# Patient Record
Sex: Female | Born: 1970 | Race: White | Hispanic: No | Marital: Single | Smoking: Never smoker
Health system: Southern US, Community
[De-identification: ages and names within clinical notes are randomized; demographics above are authoritative.]

## PROBLEM LIST (undated history)

## (undated) DIAGNOSIS — F32A Depression, unspecified: Secondary | ICD-10-CM

## (undated) DIAGNOSIS — F419 Anxiety disorder, unspecified: Secondary | ICD-10-CM

## (undated) DIAGNOSIS — K219 Gastro-esophageal reflux disease without esophagitis: Secondary | ICD-10-CM

## (undated) DIAGNOSIS — K589 Irritable bowel syndrome without diarrhea: Secondary | ICD-10-CM

## (undated) HISTORY — DX: Depression, unspecified: F32.A

## (undated) HISTORY — PX: SPINE SURGERY: SHX786

## (undated) HISTORY — DX: Anxiety disorder, unspecified: F41.9

## (undated) HISTORY — DX: Gastro-esophageal reflux disease without esophagitis: K21.9

---

## 2016-03-06 ENCOUNTER — Other Ambulatory Visit: Payer: Self-pay | Admitting: Family Medicine

## 2016-03-06 ENCOUNTER — Other Ambulatory Visit (HOSPITAL_COMMUNITY)
Admission: RE | Admit: 2016-03-06 | Discharge: 2016-03-06 | Disposition: A | Discharge status: Home / Self Care | Payer: Managed Care, Other (non HMO) | Source: Ambulatory Visit | Attending: Family Medicine | Admitting: Family Medicine

## 2016-03-06 DIAGNOSIS — Z01419 Encounter for gynecological examination (general) (routine) without abnormal findings: Secondary | ICD-10-CM | POA: Diagnosis present

## 2016-03-06 DIAGNOSIS — Z0000 Encounter for general adult medical examination without abnormal findings: Secondary | ICD-10-CM | POA: Diagnosis present

## 2016-03-09 LAB — CYTOLOGY - PAP

## 2016-06-15 DIAGNOSIS — K589 Irritable bowel syndrome without diarrhea: Secondary | ICD-10-CM

## 2016-06-15 HISTORY — DX: Irritable bowel syndrome, unspecified: K58.9

## 2018-10-21 ENCOUNTER — Encounter (HOSPITAL_COMMUNITY): Payer: Self-pay | Admitting: Emergency Medicine

## 2018-10-21 ENCOUNTER — Other Ambulatory Visit: Payer: Self-pay

## 2018-10-21 ENCOUNTER — Emergency Department (HOSPITAL_COMMUNITY)
Admission: EM | Admit: 2018-10-21 | Discharge: 2018-10-22 | Disposition: A | Discharge status: Home / Self Care | Payer: Managed Care, Other (non HMO) | Attending: Emergency Medicine | Admitting: Emergency Medicine

## 2018-10-21 DIAGNOSIS — K5900 Constipation, unspecified: Secondary | ICD-10-CM | POA: Insufficient documentation

## 2018-10-21 DIAGNOSIS — R109 Unspecified abdominal pain: Secondary | ICD-10-CM | POA: Diagnosis present

## 2018-10-21 HISTORY — DX: Irritable bowel syndrome without diarrhea: K58.9

## 2018-10-21 MED ORDER — SODIUM CHLORIDE 0.9% FLUSH: 3.0000 mL | Freq: Once | INTRAVENOUS | Status: DC

## 2018-10-21 NOTE — ED Triage Notes (Signed)
Pt presents to ED c/o abd pain w/ constipation since Monday, and bloating. Not passing gas, no n/v, no fever, no change in diet. Pt took suppository around 1800 w/ no relief and added anal discharge.

## 2018-10-22 LAB — URINALYSIS, ROUTINE W REFLEX MICROSCOPIC
Bilirubin Urine: NEGATIVE
Glucose, UA: NEGATIVE mg/dL
Ketones, ur: NEGATIVE mg/dL
Leukocytes,Ua: NEGATIVE
Nitrite: NEGATIVE
Protein, ur: NEGATIVE mg/dL
Specific Gravity, Urine: 1.005 (ref 1.005–1.030)
pH: 5 (ref 5.0–8.0)

## 2018-10-22 LAB — CBC
HCT: 41.8 % (ref 36.0–46.0)
Hemoglobin: 14 g/dL (ref 12.0–15.0)
MCH: 30.8 pg (ref 26.0–34.0)
MCHC: 33.5 g/dL (ref 30.0–36.0)
MCV: 92.1 fL (ref 80.0–100.0)
Platelets: 220 10*3/uL (ref 150–400)
RBC: 4.54 MIL/uL (ref 3.87–5.11)
RDW: 12.8 % (ref 11.5–15.5)
WBC: 13.8 10*3/uL — ABNORMAL HIGH (ref 4.0–10.5)
nRBC: 0 % (ref 0.0–0.2)

## 2018-10-22 LAB — COMPREHENSIVE METABOLIC PANEL
ALT: 16 U/L (ref 0–44)
AST: 18 U/L (ref 15–41)
Albumin: 4.1 g/dL (ref 3.5–5.0)
Alkaline Phosphatase: 78 U/L (ref 38–126)
Anion gap: 8 (ref 5–15)
BUN: 10 mg/dL (ref 6–20)
CO2: 22 mmol/L (ref 22–32)
Calcium: 9.5 mg/dL (ref 8.9–10.3)
Chloride: 106 mmol/L (ref 98–111)
Creatinine, Ser: 0.94 mg/dL (ref 0.44–1.00)
GFR calc Af Amer: >60 mL/min (ref >60)
GFR calc non Af Amer: >60 mL/min (ref >60)
Glucose, Bld: 122 mg/dL — ABNORMAL HIGH (ref 70–99)
Potassium: 4.1 mmol/L (ref 3.5–5.1)
Sodium: 136 mmol/L (ref 135–145)
Total Bilirubin: 0.6 mg/dL (ref 0.3–1.2)
Total Protein: 7.2 g/dL (ref 6.5–8.1)

## 2018-10-22 LAB — LIPASE, BLOOD: Lipase: 31 U/L (ref 11–51)

## 2018-10-22 MED ORDER — ONDANSETRON 4 MG PO TBDP: 4.0000 mg | ORAL_TABLET | Freq: Four times a day (QID) | ORAL | 0 refills | Status: DC | PRN

## 2018-10-22 MED ORDER — DICYCLOMINE HCL 20 MG PO TABS: 20.0000 mg | ORAL_TABLET | Freq: Three times a day (TID) | ORAL | 0 refills | Status: DC | PRN

## 2018-10-22 NOTE — ED Provider Notes (Signed)
TIME SEEN: 2:49 AM  CHIEF COMPLAINT: Constipation  HPI: Patient is a 48 year old female with history of IBS who presents the emergency department with constipation.  States that she was not able to have a bowel movement and felt very uncomfortable.  Tried a suppository at home without any relief.  Was not passing gas and started having increasing abdominal cramping.  Came to the ED for further evaluation.  No previous history of bowel obstruction.  No nausea, vomiting, fever.  States in the waiting room she had a large bowel movement and was able to pass gas and reports she is feeling much better and almost completely back to normal.  ROS: See HPI Constitutional: no fever  Eyes: no drainage  ENT: no runny nose   Cardiovascular:  no chest pain  Resp: no SOB  GI: no vomiting GU: no dysuria Integumentary: no rash  Allergy: no hives  Musculoskeletal: no leg swelling  Neurological: no slurred speech ROS otherwise negative  PAST MEDICAL HISTORY/PAST SURGICAL HISTORY:  Past Medical History:  Diagnosis Date  . IBS (irritable bowel syndrome) 2018    MEDICATIONS:  Prior to Admission medications   Not on File    ALLERGIES:  Not on File  SOCIAL HISTORY:  Social History   Tobacco Use  . Smoking status: Never Smoker  . Smokeless tobacco: Never Used  Substance Use Topics  . Alcohol use: Never    Frequency: Never    FAMILY HISTORY: History reviewed. No pertinent family history.  EXAM: BP 132/82 (BP Location: Right Arm)   Pulse 98   Temp 98.4 F (36.9 C) (Oral)   Resp 16   Ht 5\' 7"  (1.702 m)   Wt 75.3 kg   SpO2 99%   BMI 26.00 kg/m  CONSTITUTIONAL: Alert and oriented and responds appropriately to questions. Well-appearing; well-nourished, in no distress, afebrile, well-hydrated HEAD: Normocephalic EYES: Conjunctivae clear, pupils appear equal ENT: normal nose; moist mucous membranes NECK: Supple, normal range of motion CARD: RRR RESP: Normal chest excursion without  splinting or tachypnea;  no hypoxia or respiratory distress, speaking full sentences ABD/GI: Normal bowel sounds; non-distended; soft, non-tender, no rebound, no guarding, no peritoneal signs, no hepatosplenomegaly BACK: More range of motion EXT: Normal ROM in all joints; no major deformity noted SKIN: Normal color for age and race; warm; no rash on exposed skin NEURO: Moves all extremities equally PSYCH: The patient's mood and manner are appropriate. Grooming and personal hygiene are appropriate.  MEDICAL DECISION MAKING: Patient here with constipation.  Had a large bowel movement in the waiting room and states she is feeling back to normal.  Vital signs unremarkable.  Abdominal exam benign.  Labs show mild leukocytosis but otherwise normal.  Have low suspicion for bowel obstruction at this time.  She states she feels comfortable with plan for discharge home has a gastroenterologist.  Have recommended bowel regimen for home and will discharge with prescriptions of Bentyl and Zofran if she begins to have symptoms again.  We have discussed at length return precautions.  She verbalized understanding.  Doubt appendicitis, colitis, diverticulitis, pancreatitis, bowel perforation, pyelonephritis.  I do not feel she needs emergent imaging of her abdomen today.  At this time, I do not feel there is any life-threatening condition present. I have reviewed and discussed all results (EKG, imaging, lab, urine as appropriate) and exam findings with patient/family. I have reviewed nursing notes and appropriate previous records.  I feel the patient is safe to be discharged home without further emergent workup  and can continue workup as an outpatient as needed. Discussed usual and customary return precautions. Patient/family verbalize understanding and are comfortable with this plan.  Outpatient follow-up has been provided as needed. All questions have been answered.      Greig Altergott, Layla MawKristen N, DO 10/22/18 [id number]

## 2018-10-22 NOTE — Discharge Instructions (Signed)
I recommend that you increase your water and fiber intake. If you are not able to eat foods high in fiber, you may use Benefiber or Metamucil over-the-counter. I also recommend you use MiraLAX 1-2 times a day and Colace 100 mg twice a day to help with bowel movements. These medications are over the counter.  You may use other over-the-counter medications such as Dulcolax, Fleet enemas, magnesium citrate as needed for constipation. Please note that some of these medications may cause you to have abdominal cramping which is normal. If you develop severe abdominal pain, fever (temperature of 100.4 or higher), persistent vomiting, distention of your abdomen, unable to have a bowel movement for 5 days or are not passing gas, please return to the hospital.   Please follow-up with your gastroenterologist.

## 2018-10-22 NOTE — ED Notes (Signed)
Pt ould not  Void at this time.  Pt has a cup.

## 2018-11-02 ENCOUNTER — Encounter (HOSPITAL_COMMUNITY): Payer: Self-pay | Admitting: Emergency Medicine

## 2018-11-02 ENCOUNTER — Ambulatory Visit (HOSPITAL_COMMUNITY)
Admission: EM | Admit: 2018-11-02 | Discharge: 2018-11-02 | Disposition: A | Discharge status: Home / Self Care | Payer: Managed Care, Other (non HMO) | Attending: Family Medicine | Admitting: Family Medicine

## 2018-11-02 ENCOUNTER — Other Ambulatory Visit: Payer: Self-pay

## 2018-11-02 DIAGNOSIS — N611 Abscess of the breast and nipple: Secondary | ICD-10-CM | POA: Diagnosis not present

## 2018-11-02 MED ORDER — TRAMADOL HCL 50 MG PO TABS: 50.0000 mg | ORAL_TABLET | Freq: Four times a day (QID) | ORAL | 0 refills | Status: DC | PRN

## 2018-11-02 MED ORDER — AMOXICILLIN-POT CLAVULANATE 875-125 MG PO TABS: 1.0000 | ORAL_TABLET | Freq: Two times a day (BID) | ORAL | 0 refills | Status: AC

## 2018-11-02 NOTE — ED Triage Notes (Signed)
Pt sts left breast pain and swelling

## 2018-11-02 NOTE — Discharge Instructions (Signed)
Please begin Augmentin twice daily for the next week Warm compresses to further help with swelling and discomfort Tylenol 500-1,000 mg every 4-6 hours for pain May use tramadol for severe pain, limit use to at home or bedtime, do not drive or work after taking Please follow-up if not having any improvement with antibiotics in approximately 3 days as we may need to refer you to the breast center for aspiration/drainage of this abscess.

## 2018-11-03 NOTE — ED Provider Notes (Signed)
[facility]    CSN: [id number] Arrival date & time: 11/02/18  1803     History   Chief Complaint Chief Complaint  Patient presents with  . Breast Pain    HPI Rhonda Wood is a 48 y.o. female history of IBS presenting today for evaluation of left breast pain and swelling.  Patient states that over the past 2 days she has developed increased redness pain and swelling around her left areola.  She has not noticed any discharge.  Denies any fevers.  Denies history of similar.  Denies breast-feeding.  She has not take anything over-the-counter for her symptoms.  HPI  Past Medical History:  Diagnosis Date  . IBS (irritable bowel syndrome) 2018    There are no active problems to display for this patient.   History reviewed. No pertinent surgical history.  OB History   No obstetric history on file.      Home Medications    Prior to Admission medications   Medication Sig Start Date End Date Taking? Authorizing Provider  amoxicillin-clavulanate (AUGMENTIN) 875-125 MG tablet Take 1 tablet by mouth every 12 (twelve) hours for 7 days. 11/02/18 11/09/18  Wieters, Hallie C, PA-C  dicyclomine (BENTYL) 20 MG tablet Take 1 tablet (20 mg total) by mouth every 8 (eight) hours as needed for spasms (Abdominal cramping). 10/22/18   Ward, Layla Maw, DO  ondansetron (ZOFRAN ODT) 4 MG disintegrating tablet Take 1 tablet (4 mg total) by mouth every 6 (six) hours as needed. 10/22/18   Ward, Layla Maw, DO  traMADol (ULTRAM) 50 MG tablet Take 1 tablet (50 mg total) by mouth every 6 (six) hours as needed for severe pain. 11/02/18   Wieters, Junius Creamer, PA-C    Family History History reviewed. No pertinent family history.  Social History Social History   Tobacco Use  . Smoking status: Never Smoker  . Smokeless tobacco: Never Used  Substance Use Topics  . Alcohol use: Never    Frequency: Never  . Drug use: Never     Allergies   Patient has no known allergies.   Review of Systems  Review of Systems  Constitutional: Negative for fatigue and fever.  HENT: Negative for mouth sores.   Eyes: Negative for visual disturbance.  Respiratory: Negative for shortness of breath.   Cardiovascular: Negative for chest pain.  Gastrointestinal: Negative for abdominal pain, nausea and vomiting.  Genitourinary: Negative for genital sores.  Musculoskeletal: Negative for arthralgias and joint swelling.  Skin: Positive for color change. Negative for rash and wound.  Neurological: Negative for dizziness, weakness, light-headedness and headaches.     Physical Exam Triage Vital Signs ED Triage Vitals [11/02/18 1824]  Enc Vitals Group     BP 133/75     Pulse Rate 99     Resp 18     Temp 99.1 F (37.3 C)     Temp Source Oral     SpO2 100 %     Weight      Height      Head Circumference      Peak Flow      Pain Score 6     Pain Loc      Pain Edu?      Excl. in GC?    No data found.  Updated Vital Signs BP 133/75 (BP Location: Right Arm)   Pulse 99   Temp 99.1 F (37.3 C) (Oral)   Resp 18   SpO2 100%   Visual Acuity Right Eye  Distance:   Left Eye Distance:   Bilateral Distance:    Right Eye Near:   Left Eye Near:    Bilateral Near:     Physical Exam Vitals signs and nursing note reviewed.  Constitutional:      Appearance: She is well-developed.     Comments: No acute distress  HENT:     Head: Normocephalic and atraumatic.     Nose: Nose normal.  Eyes:     Conjunctiva/sclera: Conjunctivae normal.  Neck:     Musculoskeletal: Neck supple.  Cardiovascular:     Rate and Rhythm: Normal rate.  Pulmonary:     Effort: Pulmonary effort is normal. No respiratory distress.  Chest:       Comments: Left upper outer quadrant of left breast with erythema induration and tenderness to touch; no discharge expressed from nipple Abdominal:     General: There is no distension.  Musculoskeletal: Normal range of motion.  Skin:    General: Skin is warm and dry.   Neurological:     Mental Status: She is alert and oriented to person, place, and time.      UC Treatments / Results  Labs (all labs ordered are listed, but only abnormal results are displayed) Labs Reviewed - No data to display  EKG None  Radiology No results found.  Procedures Procedures (including critical care time)  Medications Ordered in UC Medications - No data to display  Initial Impression / Assessment and Plan / UC Course  I have reviewed the triage vital signs and the nursing notes.  Pertinent labs & imaging results that were available during my care of the patient were reviewed by me and considered in my medical decision making (see chart for details).     Patient appears to have a cellulitis versus abscess of left breast.  Will initiate on Augmentin twice daily for the next week.  Will have follow-up in 3 days if not having any improvement with oral antibiotics as may need referral to breast center for aspiration.  Warm compresses, Tylenol.  Tramadol for severe pain.Discussed strict return precautions. Patient verbalized understanding and is agreeable with plan.  Final Clinical Impressions(s) / UC Diagnoses   Final diagnoses:  Left breast abscess     Discharge Instructions     Please begin Augmentin twice daily for the next week Warm compresses to further help with swelling and discomfort Tylenol 500-1,000 mg every 4-6 hours for pain May use tramadol for severe pain, limit use to at home or bedtime, do not drive or work after taking Please follow-up if not having any improvement with antibiotics in approximately 3 days as we may need to refer you to the breast center for aspiration/drainage of this abscess.   ED Prescriptions    Medication Sig Dispense Auth. Provider   amoxicillin-clavulanate (AUGMENTIN) 875-125 MG tablet Take 1 tablet by mouth every 12 (twelve) hours for 7 days. 14 tablet Wieters, Hallie C, PA-C   traMADol (ULTRAM) 50 MG tablet Take 1  tablet (50 mg total) by mouth every 6 (six) hours as needed for severe pain. 8 tablet Wieters, DorranceHallie C, PA-C     Controlled Substance Prescriptions [place] Controlled Substance Registry consulted? Yes, I have consulted the  Controlled Substances Registry for this patient, and feel the risk/benefit ratio today is favorable for proceeding with this prescription for a controlled substance.   Wieters, RyanHallie C, PA-C 11/03/18 1016

## 2018-11-14 ENCOUNTER — Other Ambulatory Visit: Payer: Self-pay

## 2018-11-14 ENCOUNTER — Encounter (HOSPITAL_COMMUNITY): Payer: Self-pay

## 2018-11-14 ENCOUNTER — Ambulatory Visit (HOSPITAL_COMMUNITY)
Admission: EM | Admit: 2018-11-14 | Discharge: 2018-11-14 | Disposition: A | Discharge status: Home / Self Care | Payer: Managed Care, Other (non HMO) | Attending: Family Medicine | Admitting: Family Medicine

## 2018-11-14 DIAGNOSIS — N61 Mastitis without abscess: Secondary | ICD-10-CM

## 2018-11-14 MED ORDER — ONDANSETRON 4 MG PO TBDP: 4.0000 mg | ORAL_TABLET | Freq: Four times a day (QID) | ORAL | 0 refills | Status: DC | PRN

## 2018-11-14 MED ORDER — DOXYCYCLINE HYCLATE 100 MG PO TABS: 100.0000 mg | ORAL_TABLET | Freq: Two times a day (BID) | ORAL | 0 refills | Status: DC

## 2018-11-14 NOTE — ED Provider Notes (Signed)
[facility]    CSN: [id number] Arrival date & time: 11/14/18  1022     History   Chief Complaint Chief Complaint  Patient presents with  . follow up on breast    HPI Jenique Berendsen is a 48 y.o. female.   48 yo woman here for follow up of left breast abscess, seen recently and treated with antibiotics and pain medicine.  She had had a cat scratch the areola. The augmentin seemed to help for awhile, but the pain and swelling have continued.    Note from 11/02/18: Ayviana Canto is a 48 y.o. female history of IBS presenting today for evaluation of left breast pain and swelling.  Patient states that over the past 2 days she has developed increased redness pain and swelling around her left areola.  She has not noticed any discharge.  Denies any fevers.  Denies history of similar.  Denies breast-feeding.  She has not take anything over-the-counter for her symptoms.     Comments: Left upper outer quadrant of left breast with erythema induration and tenderness to touch; no discharge expressed from nipple  Rx:  Augmentin and Tramadol     Past Medical History:  Diagnosis Date  . IBS (irritable bowel syndrome) 2018    There are no active problems to display for this patient.   History reviewed. No pertinent surgical history.  OB History   No obstetric history on file.      Home Medications    Prior to Admission medications   Medication Sig Start Date End Date Taking? Authorizing Provider  dicyclomine (BENTYL) 20 MG tablet Take 1 tablet (20 mg total) by mouth every 8 (eight) hours as needed for spasms (Abdominal cramping). 10/22/18   Ward, Layla Maw, DO  doxycycline (VIBRA-TABS) 100 MG tablet Take 1 tablet (100 mg total) by mouth 2 (two) times daily. 11/14/18   Elvina Sidle, MD  ondansetron (ZOFRAN ODT) 4 MG disintegrating tablet Take 1 tablet (4 mg total) by mouth every 6 (six) hours as needed. 11/14/18   Elvina Sidle, MD  traMADol (ULTRAM) 50 MG tablet Take  1 tablet (50 mg total) by mouth every 6 (six) hours as needed for severe pain. 11/02/18   Wieters, Junius Creamer, PA-C    Family History History reviewed. No pertinent family history.  Social History Social History   Tobacco Use  . Smoking status: Never Smoker  . Smokeless tobacco: Never Used  Substance Use Topics  . Alcohol use: Never    Frequency: Never  . Drug use: Never     Allergies   Patient has no known allergies.   Review of Systems Review of Systems  Cardiovascular: Positive for chest pain.  Skin: Positive for rash.  All other systems reviewed and are negative.    Physical Exam Triage Vital Signs ED Triage Vitals [11/14/18 1047]  Enc Vitals Group     BP      Pulse      Resp      Temp      Temp src      SpO2      Weight 165 lb (74.8 kg)     Height      Head Circumference      Peak Flow      Pain Score 4     Pain Loc      Pain Edu?      Excl. in GC?    No data found.  Updated Vital Signs BP 116/81 (BP Location:  Right Arm)   Pulse 84   Temp 98.2 F (36.8 C) (Oral)   Resp 18   Wt 74.8 kg   LMP 10/14/2018   SpO2 99%   BMI 25.84 kg/m    Physical Exam Vitals signs and nursing note reviewed.  Constitutional:      Appearance: Normal appearance.  Eyes:     Conjunctiva/sclera: Conjunctivae normal.  Neck:     Musculoskeletal: Normal range of motion and neck supple.  Pulmonary:     Effort: Pulmonary effort is normal.  Chest:    Musculoskeletal: Normal range of motion.  Skin:    Comments: Tender, indurated, nonfluctuant sub areolar mass with overlying erythema left breast  Neurological:     General: No focal deficit present.     Mental Status: She is alert.  Psychiatric:        Mood and Affect: Mood normal.      UC Treatments / Results  Labs (all labs ordered are listed, but only abnormal results are displayed) Labs Reviewed - No data to display  EKG None  Radiology No results found.  Procedures Procedures (including critical  care time)  Medications Ordered in UC Medications - No data to display  Initial Impression / Assessment and Plan / UC Course  I have reviewed the triage vital signs and the nursing notes.  Pertinent labs & imaging results that were available during my care of the patient were reviewed by me and considered in my medical decision making (see chart for details).    Final Clinical Impressions(s) / UC Diagnoses   Final diagnoses:  Cellulitis of breast     Discharge Instructions     The breast center should be calling you today to set up an appointment  I have written the name of a good general surgeon to help you if you need surgery  Start the doxycycline today with food.  Continue the moist warm compresses three times a day    ED Prescriptions    Medication Sig Dispense Auth. Provider   ondansetron (ZOFRAN ODT) 4 MG disintegrating tablet Take 1 tablet (4 mg total) by mouth every 6 (six) hours as needed. 20 tablet Elvina SidleLauenstein, Wiletta Bermingham, MD   doxycycline (VIBRA-TABS) 100 MG tablet Take 1 tablet (100 mg total) by mouth 2 (two) times daily. 20 tablet Elvina SidleLauenstein, Yasmine Kilbourne, MD     Controlled Substance Prescriptions [place] Controlled Substance Registry consulted? Not Applicable   Elvina SidleLauenstein, Areta Terwilliger, MD 11/14/18 1110

## 2018-11-14 NOTE — ED Triage Notes (Signed)
Pt states she's here for a f/u on her left breast. Pt states she has finished her antibiotics for the lump in her left breast. Pt states its sore.

## 2018-11-14 NOTE — Discharge Instructions (Signed)
The breast center should be calling you today to set up an appointment  I have written the name of a good general surgeon to help you if you need surgery  Start the doxycycline today with food.  Continue the moist warm compresses three times a day

## 2018-11-15 ENCOUNTER — Other Ambulatory Visit: Payer: Self-pay | Admitting: Family Medicine

## 2018-11-15 DIAGNOSIS — N644 Mastodynia: Secondary | ICD-10-CM

## 2018-11-30 ENCOUNTER — Ambulatory Visit
Admission: RE | Admit: 2018-11-30 | Discharge: 2018-11-30 | Disposition: A | Discharge status: Home / Self Care | Payer: Managed Care, Other (non HMO) | Source: Ambulatory Visit | Attending: Family Medicine | Admitting: Family Medicine

## 2018-11-30 ENCOUNTER — Other Ambulatory Visit: Payer: Self-pay

## 2018-11-30 DIAGNOSIS — N644 Mastodynia: Secondary | ICD-10-CM

## 2019-07-23 IMAGING — MG DIGITAL DIAGNOSTIC BILATERAL MAMMOGRAM WITH TOMO AND CAD
8 series · 8 of 24 positions shown · non-contrast
Comparison: None.

CLINICAL DATA: Patient complains of diffuse left breast pain and a
palpable abnormality. Patient states that she was recently treated
with antibiotics for a left breast infection.

EXAM:
DIGITAL DIAGNOSTIC BILATERAL MAMMOGRAM WITH CAD AND TOMO
ULTRASOUND LEFT BREAST

[R MLO synth-2D]
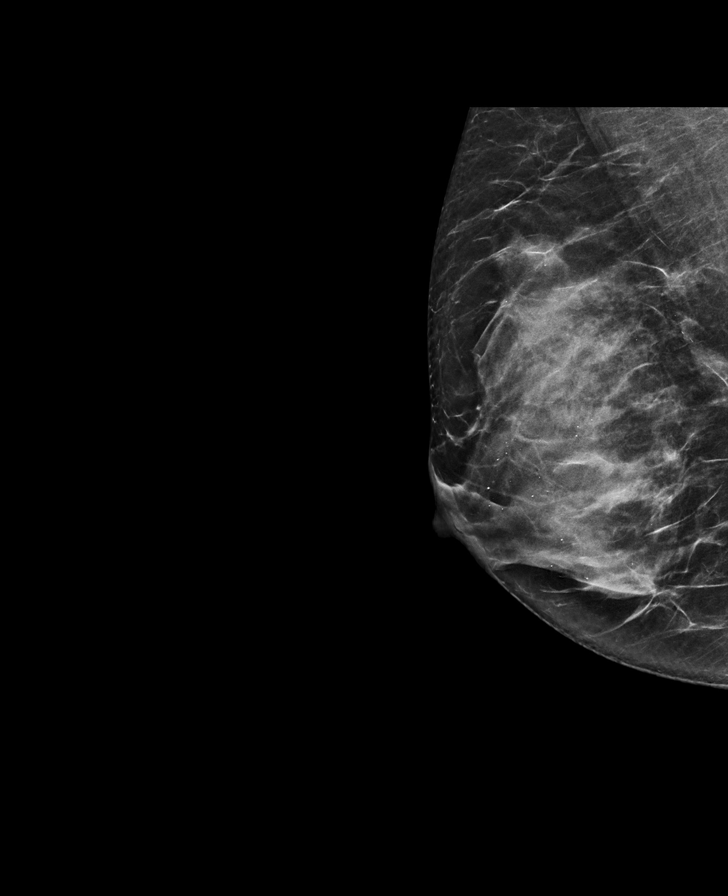

[L CC synth-2D]
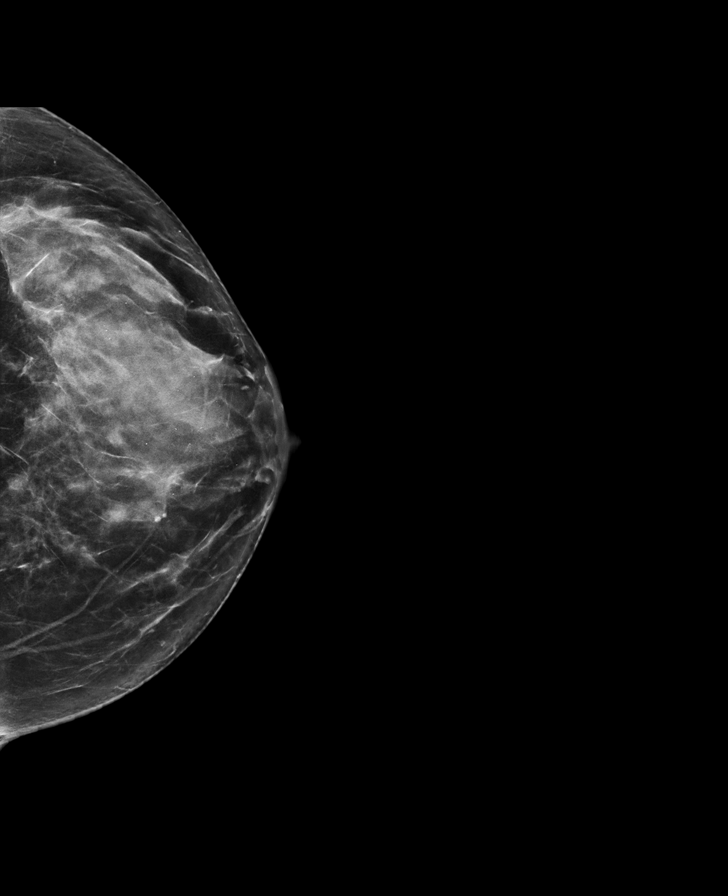

[R CC synth-2D]
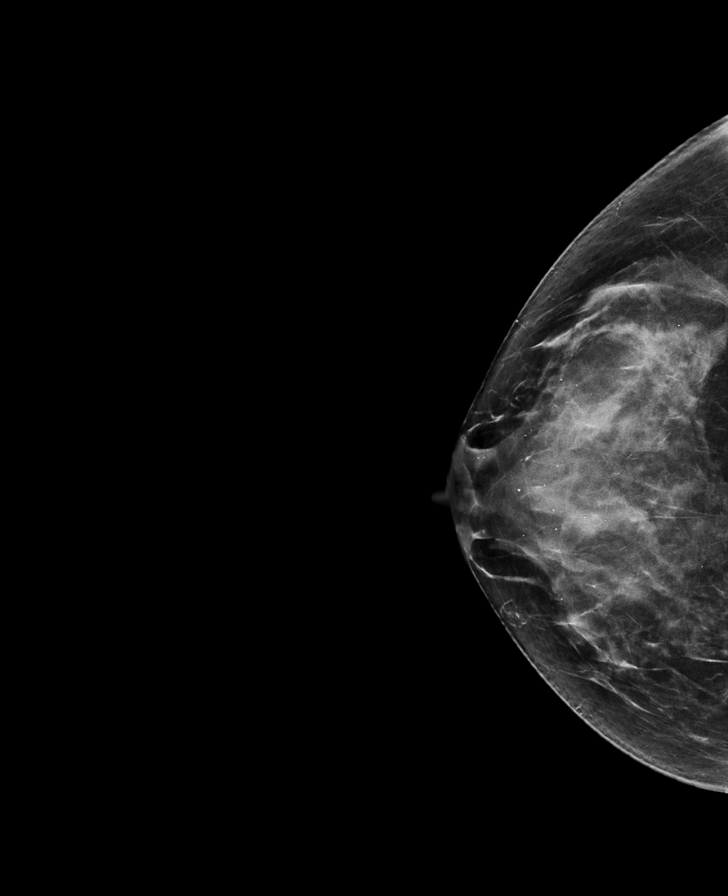

[L MLO synth-2D]
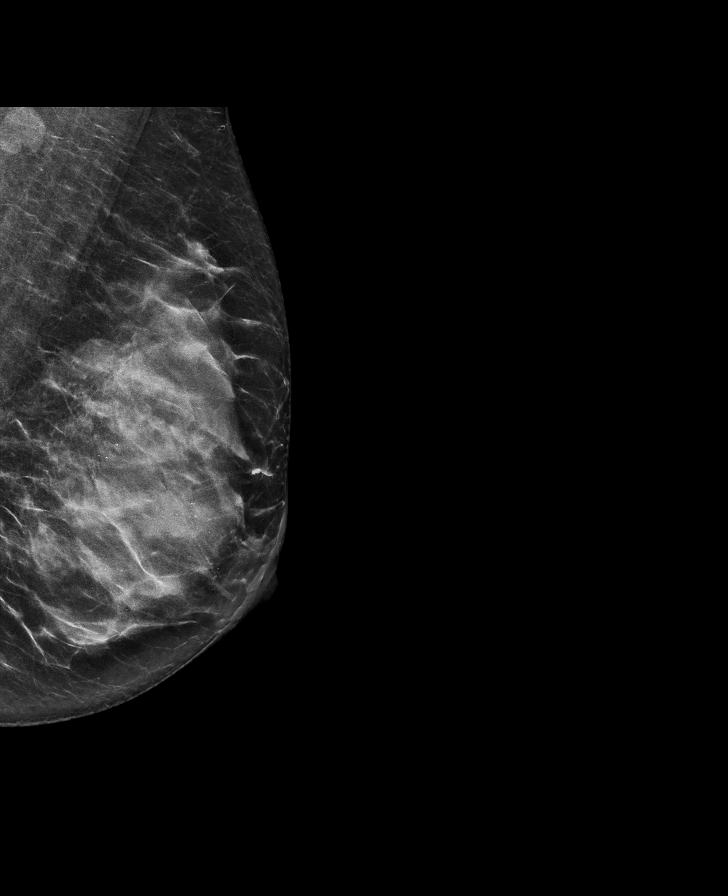

[L CC tomo · tomo slice 36/71.0]
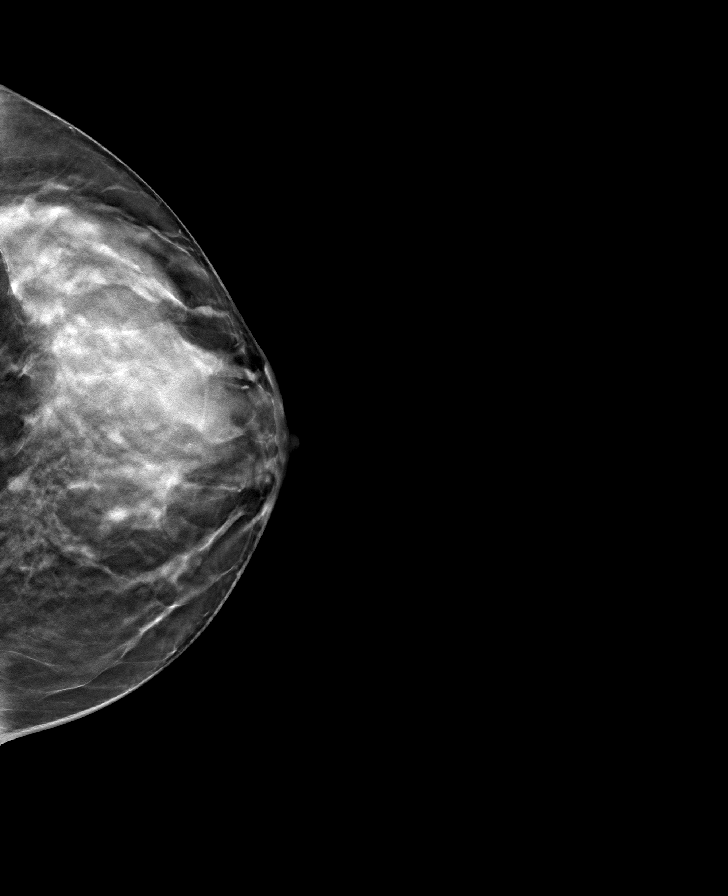

[R CC tomo · tomo slice 37/73.0]
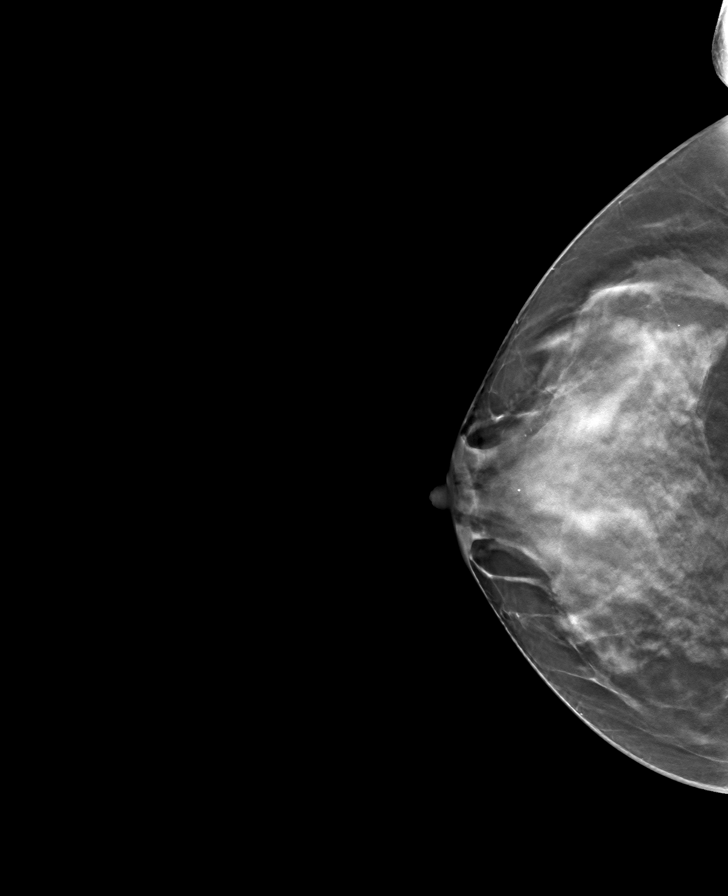

[R MLO tomo · tomo slice 35/68.0]
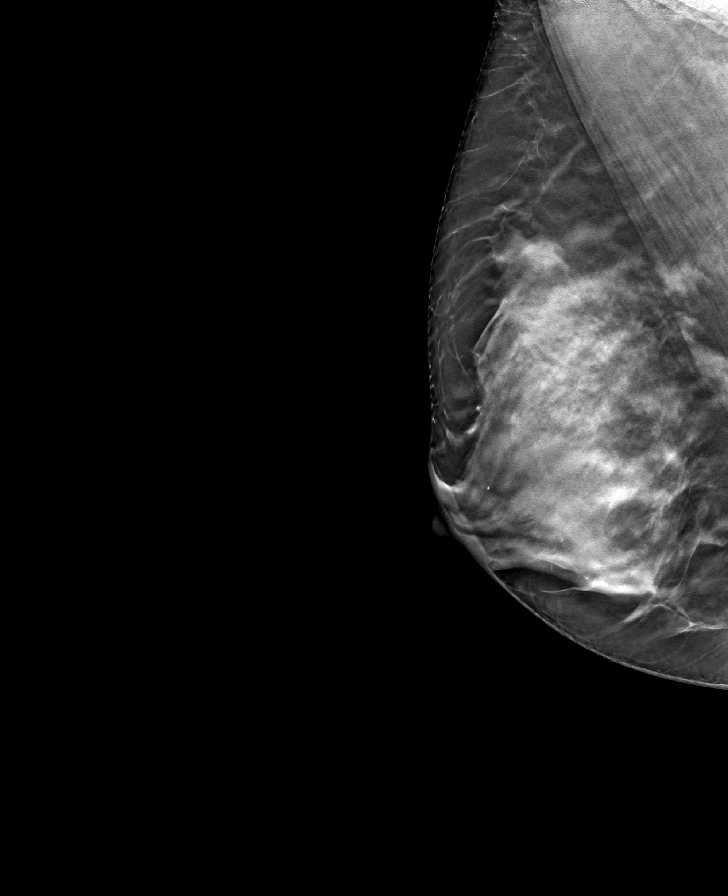

[L MLO tomo · tomo slice 35/70.0]
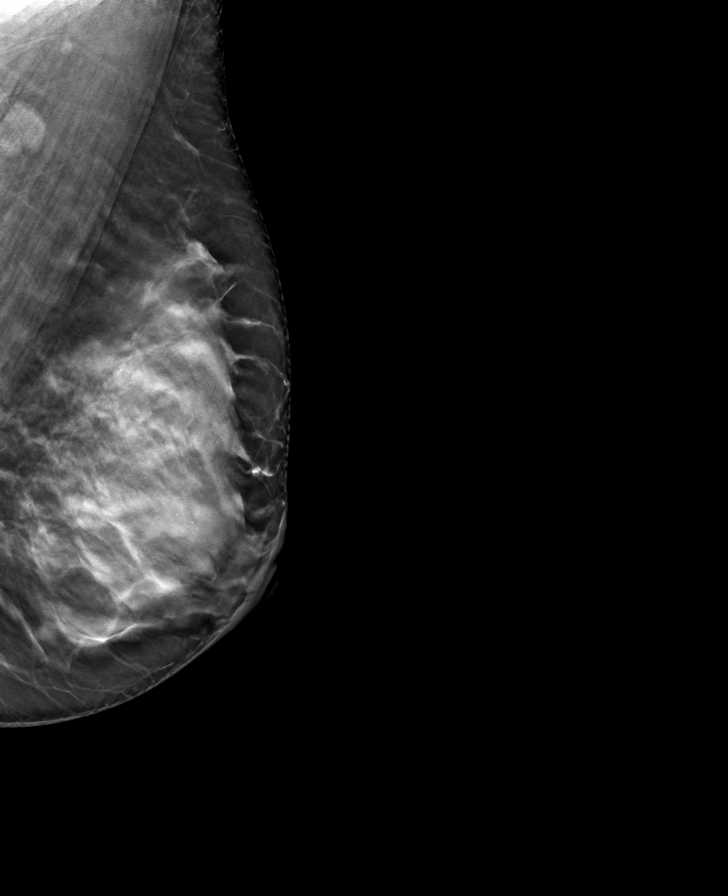

[8 of 24 positions shown; findings below may reference images not displayed]

ACR Breast Density Category c: The breast tissue is heterogeneously
dense, which may obscure small masses.
FINDINGS: No suspicious mass, malignant type microcalcifications or distortion
detected in either breast.

Mammographic images were processed with CAD.

On physical exam, I do not palpate a discrete mass in the
retroareolar region of the left breast.

Targeted ultrasound is performed, showing normal tissue in the
retroareolar region of the left breast. No solid or cystic mass,
abnormal shadowing or distortion visualized.
IMPRESSION: No evidence of malignancy in either breast.

RECOMMENDATION:
Bilateral screening mammogram in 1 year is recommended.

I have discussed the findings and recommendations with the patient.
Results were also provided in writing at the conclusion of the
visit. If applicable, a reminder letter will be sent to the patient
regarding the next appointment.

BI-RADS CATEGORY  1: Negative.

## 2019-09-11 ENCOUNTER — Ambulatory Visit: Payer: Managed Care, Other (non HMO)

## 2020-10-07 ENCOUNTER — Encounter (HOSPITAL_COMMUNITY): Payer: Self-pay

## 2020-10-07 ENCOUNTER — Ambulatory Visit (HOSPITAL_COMMUNITY)
Admission: EM | Admit: 2020-10-07 | Discharge: 2020-10-07 | Disposition: A | Discharge status: Home / Self Care | Payer: Managed Care, Other (non HMO) | Attending: Emergency Medicine | Admitting: Emergency Medicine

## 2020-10-07 DIAGNOSIS — M5442 Lumbago with sciatica, left side: Secondary | ICD-10-CM | POA: Diagnosis not present

## 2020-10-07 DIAGNOSIS — M5441 Lumbago with sciatica, right side: Secondary | ICD-10-CM

## 2020-10-07 MED ORDER — ORPHENADRINE CITRATE ER 100 MG PO TB12: 100.0000 mg | ORAL_TABLET | Freq: Two times a day (BID) | ORAL | 0 refills | Status: DC | PRN

## 2020-10-07 MED ORDER — METHYLPREDNISOLONE ACETATE 80 MG/ML IJ SUSP
80.0000 mg | Freq: Once | INTRAMUSCULAR | Status: AC
  Administered 2020-10-07: 80 mg via INTRAMUSCULAR

## 2020-10-07 MED ORDER — METHYLPREDNISOLONE 4 MG PO TBPK: ORAL_TABLET | ORAL | 0 refills | Status: DC

## 2020-10-07 MED ORDER — METHYLPREDNISOLONE ACETATE 80 MG/ML IJ SUSP
INTRAMUSCULAR | Status: AC
  Filled 2020-10-07: qty 1

## 2020-10-07 NOTE — ED Provider Notes (Signed)
[facility]    CSN: [id number] Arrival date & time: 10/07/20  0825      History   Chief Complaint Chief Complaint  Patient presents with  . Back Pain    HPI Rhonda Wood is a 50 y.o. female.   Rhonda Wood presents with complaints of low back pain which radiates down bilateral posterior thighs. Started around a month ago without any specific known trigger. She works as an Environmental health practitioner, does life some packages occasionally. No numbness tingling or weakness to lower legs, no saddle symptoms. No loss of bladder or bowel. Pain worse to left side. Has had low back issues in the past but this is more severe and lasting longer. No fevers. She went to another UC last week and was given muscle relaxers which did provide some relief. Ibuprofen only provides some relief, but causes significant gi distress causing her to vomit when she takes, so has taken minimally. Doesn't have a PCP. Pain is worse with bending such as with putting on her socks, or with raising her legs such as with pressing the gas pedal of her car.    ROS per HPI, negative if not otherwise mentioned.      Past Medical History:  Diagnosis Date  . IBS (irritable bowel syndrome) 2018    There are no problems to display for this patient.   History reviewed. No pertinent surgical history.  OB History   No obstetric history on file.      Home Medications    Prior to Admission medications   Medication Sig Start Date End Date Taking? Authorizing Provider  methylPREDNISolone (MEDROL DOSEPAK) 4 MG TBPK tablet Per box instructions 10/07/20  Yes Cullan Launer, Dorene Grebe B, NP  orphenadrine (NORFLEX) 100 MG tablet Take 1 tablet (100 mg total) by mouth 2 (two) times daily as needed for muscle spasms. 10/07/20  Yes Laurina Fischl, Barron Alvine, NP  dicyclomine (BENTYL) 20 MG tablet Take 1 tablet (20 mg total) by mouth every 8 (eight) hours as needed for spasms (Abdominal cramping). 10/22/18   Ward, Layla Maw, DO   doxycycline (VIBRA-TABS) 100 MG tablet Take 1 tablet (100 mg total) by mouth 2 (two) times daily. 11/14/18   Elvina Sidle, MD  ondansetron (ZOFRAN ODT) 4 MG disintegrating tablet Take 1 tablet (4 mg total) by mouth every 6 (six) hours as needed. 11/14/18   Elvina Sidle, MD  traMADol (ULTRAM) 50 MG tablet Take 1 tablet (50 mg total) by mouth every 6 (six) hours as needed for severe pain. 11/02/18   Wieters, Junius Creamer, PA-C    Family History History reviewed. No pertinent family history.  Social History Social History   Tobacco Use  . Smoking status: Never Smoker  . Smokeless tobacco: Never Used  Substance Use Topics  . Alcohol use: Never  . Drug use: Never     Allergies   Patient has no known allergies.   Review of Systems Review of Systems   Physical Exam Triage Vital Signs ED Triage Vitals  Enc Vitals Group     BP 10/07/20 0925 133/82     Pulse Rate 10/07/20 0925 81     Resp 10/07/20 0925 18     Temp 10/07/20 0925 98.3 F (36.8 C)     Temp src --      SpO2 10/07/20 0925 99 %     Weight --      Height --      Head Circumference --      Peak Flow --  Pain Score 10/07/20 0922 6     Pain Loc --      Pain Edu? --      Excl. in GC? --    No data found.  Updated Vital Signs BP 133/82   Pulse 81   Temp 98.3 F (36.8 C)   Resp 18   LMP 10/04/2020 (Exact Date)   SpO2 99%   Visual Acuity Right Eye Distance:   Left Eye Distance:   Bilateral Distance:    Right Eye Near:   Left Eye Near:    Bilateral Near:     Physical Exam Constitutional:      General: She is not in acute distress.    Appearance: She is well-developed.  Cardiovascular:     Rate and Rhythm: Normal rate.  Pulmonary:     Effort: Pulmonary effort is normal.  Musculoskeletal:     Lumbar back: Tenderness and bony tenderness present. Decreased range of motion. Positive right straight leg raise test and positive left straight leg raise test.     Comments: Pain at sacrum and distal  lumbar spine with greater pain to left paraspinal musculature; pain with bilateral SLR, with L>R; strength equal bilaterally; gross sensation intact to lower extremities; pain with lumbar flexion   Skin:    General: Skin is warm and dry.  Neurological:     Mental Status: She is alert and oriented to person, place, and time.      UC Treatments / Results  Labs (all labs ordered are listed, but only abnormal results are displayed) Labs Reviewed - No data to display  EKG   Radiology No results found.  Procedures Procedures (including critical care time)  Medications Ordered in UC Medications  methylPREDNISolone acetate (DEPO-MEDROL) injection 80 mg (80 mg Intramuscular Given 10/07/20 0943)    Initial Impression / Assessment and Plan / UC Course  I have reviewed the triage vital signs and the nursing notes.  Pertinent labs & imaging results that were available during my care of the patient were reviewed by me and considered in my medical decision making (see chart for details).     Low back pain and sciatica; no traumatic injury. No red flag findings. Unable to tolerate nsaids- vomiting even with ibuprofen. Opted to try medrol with im dose given today and dosepack orally provided, in addition to muscle relaxers. Encouraged follow up for further evaluation and management. Patient verbalized understanding and agreeable to plan.   Final Clinical Impressions(s) / UC Diagnoses   Final diagnoses:  Acute bilateral low back pain with bilateral sciatica     Discharge Instructions     Light and regular activity as tolerated.  See exercises provided.  Heat application while active can help with muscle spasms.  Sleep with pillow under your knees.   Muscle relaxer as needed for spasms, be mindful of drowsiness it may cause.  I am hopeful that the steroid provided helps with pain and swelling without causing the stomach issues that ibuprofen can cause.  Please follow up with  orthopedics or spine specialist, or establish with a PCP as you may need further evaluation and management.    ED Prescriptions    Medication Sig Dispense Auth. Provider   orphenadrine (NORFLEX) 100 MG tablet Take 1 tablet (100 mg total) by mouth 2 (two) times daily as needed for muscle spasms. 20 tablet Linus Mako B, NP   methylPREDNISolone (MEDROL DOSEPAK) 4 MG TBPK tablet Per box instructions 21 tablet Georgetta Haber, NP  PDMP not reviewed this encounter.   Georgetta Haber, NP 10/07/20 1008

## 2020-10-07 NOTE — ED Triage Notes (Signed)
Pt in with c/o lower back pain that started about 4 weeks ago/ pt states the pain is radiating to her right leg  Pt has been taking muscle relaxer and advil for sxs

## 2020-10-07 NOTE — Discharge Instructions (Addendum)
Light and regular activity as tolerated.  See exercises provided.  Heat application while active can help with muscle spasms.  Sleep with pillow under your knees.   Muscle relaxer as needed for spasms, be mindful of drowsiness it may cause.  I am hopeful that the steroid provided helps with pain and swelling without causing the stomach issues that ibuprofen can cause.  Please follow up with orthopedics or spine specialist, or establish with a PCP as you may need further evaluation and management.

## 2020-11-11 HISTORY — PX: BACK SURGERY: SHX140

## 2021-04-15 ENCOUNTER — Other Ambulatory Visit: Payer: Self-pay

## 2021-04-15 ENCOUNTER — Encounter: Payer: Self-pay | Admitting: Emergency Medicine

## 2021-04-15 ENCOUNTER — Ambulatory Visit (INDEPENDENT_AMBULATORY_CARE_PROVIDER_SITE_OTHER): Payer: Managed Care, Other (non HMO) | Admitting: Emergency Medicine

## 2021-04-15 VITALS — BP 112/60 | HR 83 | Ht 67.0 in | Wt 170.0 lb

## 2021-04-15 DIAGNOSIS — Z833 Family history of diabetes mellitus: Secondary | ICD-10-CM

## 2021-04-15 DIAGNOSIS — Z0000 Encounter for general adult medical examination without abnormal findings: Secondary | ICD-10-CM | POA: Diagnosis not present

## 2021-04-15 DIAGNOSIS — Z1211 Encounter for screening for malignant neoplasm of colon: Secondary | ICD-10-CM

## 2021-04-15 DIAGNOSIS — Z13220 Encounter for screening for lipoid disorders: Secondary | ICD-10-CM

## 2021-04-15 DIAGNOSIS — Z8719 Personal history of other diseases of the digestive system: Secondary | ICD-10-CM | POA: Insufficient documentation

## 2021-04-15 DIAGNOSIS — Z1329 Encounter for screening for other suspected endocrine disorder: Secondary | ICD-10-CM

## 2021-04-15 DIAGNOSIS — Z130 Encounter for screening for diseases of the blood and blood-forming organs and certain disorders involving the immune mechanism: Secondary | ICD-10-CM

## 2021-04-15 DIAGNOSIS — Z13228 Encounter for screening for other metabolic disorders: Secondary | ICD-10-CM

## 2021-04-15 DIAGNOSIS — Z7689 Persons encountering health services in other specified circumstances: Secondary | ICD-10-CM

## 2021-04-15 DIAGNOSIS — Z1321 Encounter for screening for nutritional disorder: Secondary | ICD-10-CM

## 2021-04-15 LAB — CBC WITH DIFFERENTIAL/PLATELET
Basophils Absolute: 0.1 10*3/uL (ref 0.0–0.1)
Basophils Relative: 0.9 % (ref 0.0–3.0)
Eosinophils Absolute: 0 10*3/uL (ref 0.0–0.7)
Eosinophils Relative: 0.4 % (ref 0.0–5.0)
HCT: 39.6 % (ref 36.0–46.0)
Hemoglobin: 13.3 g/dL (ref 12.0–15.0)
Lymphocytes Relative: 36.2 % (ref 12.0–46.0)
Lymphs Abs: 2.3 10*3/uL (ref 0.7–4.0)
MCHC: 33.6 g/dL (ref 30.0–36.0)
MCV: 90.5 fl (ref 78.0–100.0)
Monocytes Absolute: 0.5 10*3/uL (ref 0.1–1.0)
Monocytes Relative: 7.7 % (ref 3.0–12.0)
Neutro Abs: 3.6 10*3/uL (ref 1.4–7.7)
Neutrophils Relative %: 54.8 % (ref 43.0–77.0)
Platelets: 207 10*3/uL (ref 150.0–400.0)
RBC: 4.38 Mil/uL (ref 3.87–5.11)
RDW: 13.5 % (ref 11.5–15.5)
WBC: 6.5 10*3/uL (ref 4.0–10.5)

## 2021-04-15 LAB — COMPREHENSIVE METABOLIC PANEL
ALT: 17 U/L (ref 0–35)
AST: 18 U/L (ref 0–37)
Albumin: 4.4 g/dL (ref 3.5–5.2)
Alkaline Phosphatase: 88 U/L (ref 39–117)
BUN: 10 mg/dL (ref 6–23)
CO2: 24 mEq/L (ref 19–32)
Calcium: 9.4 mg/dL (ref 8.4–10.5)
Chloride: 104 mEq/L (ref 96–112)
Creatinine, Ser: 0.88 mg/dL (ref 0.40–1.20)
GFR: 77.15 mL/min (ref >60.00)
Glucose, Bld: 92 mg/dL (ref 70–99)
Potassium: 4.3 mEq/L (ref 3.5–5.1)
Sodium: 137 mEq/L (ref 135–145)
Total Bilirubin: 0.4 mg/dL (ref 0.2–1.2)
Total Protein: 7.3 g/dL (ref 6.0–8.3)

## 2021-04-15 LAB — LIPID PANEL
Cholesterol: 180 mg/dL (ref 0–200)
HDL: 59.4 mg/dL (ref >39.00)
NonHDL: 120.14
Total CHOL/HDL Ratio: 3
Triglycerides: 217 mg/dL — ABNORMAL HIGH (ref 0.0–149.0)
VLDL: 43.4 mg/dL — ABNORMAL HIGH (ref 0.0–40.0)

## 2021-04-15 LAB — HEMOGLOBIN A1C: Hgb A1c MFr Bld: 5.6 % (ref 4.6–6.5)

## 2021-04-15 LAB — LDL CHOLESTEROL, DIRECT: Direct LDL: 84 mg/dL

## 2021-04-15 NOTE — Patient Instructions (Signed)
Health Maintenance, Female Adopting a healthy lifestyle and getting preventive care are important in promoting health and wellness. Ask your health care provider about: The right schedule for you to have regular tests and exams. Things you can do on your own to prevent diseases and keep yourself healthy. What should I know about diet, weight, and exercise? Eat a healthy diet  Eat a diet that includes plenty of vegetables, fruits, low-fat dairy products, and lean protein. Do not eat a lot of foods that are high in solid fats, added sugars, or sodium. Maintain a healthy weight Body mass index (BMI) is used to identify weight problems. It estimates body fat based on height and weight. Your health care provider can help determine your BMI and help you achieve or maintain a healthy weight. Get regular exercise Get regular exercise. This is one of the most important things you can do for your health. Most adults should: Exercise for at least 150 minutes each week. The exercise should increase your heart rate and make you sweat (moderate-intensity exercise). Do strengthening exercises at least twice a week. This is in addition to the moderate-intensity exercise. Spend less time sitting. Even light physical activity can be beneficial. Watch cholesterol and blood lipids Have your blood tested for lipids and cholesterol at 49 years of age, then have this test every 5 years. Have your cholesterol levels checked more often if: Your lipid or cholesterol levels are high. You are older than 49 years of age. You are at high risk for heart disease. What should I know about cancer screening? Depending on your health history and family history, you may need to have cancer screening at various ages. This may include screening for: Breast cancer. Cervical cancer. Colorectal cancer. Skin cancer. Lung cancer. What should I know about heart disease, diabetes, and high blood pressure? Blood pressure and heart  disease High blood pressure causes heart disease and increases the risk of stroke. This is more likely to develop in people who have high blood pressure readings, are of African descent, or are overweight. Have your blood pressure checked: Every 3-5 years if you are 18-39 years of age. Every year if you are 40 years old or older. Diabetes Have regular diabetes screenings. This checks your fasting blood sugar level. Have the screening done: Once every three years after age 40 if you are at a normal weight and have a low risk for diabetes. More often and at a younger age if you are overweight or have a high risk for diabetes. What should I know about preventing infection? Hepatitis B If you have a higher risk for hepatitis B, you should be screened for this virus. Talk with your health care provider to find out if you are at risk for hepatitis B infection. Hepatitis C Testing is recommended for: Everyone born from 1945 through 1965. Anyone with known risk factors for hepatitis C. Sexually transmitted infections (STIs) Get screened for STIs, including gonorrhea and chlamydia, if: You are sexually active and are younger than 49 years of age. You are older than 49 years of age and your health care provider tells you that you are at risk for this type of infection. Your sexual activity has changed since you were last screened, and you are at increased risk for chlamydia or gonorrhea. Ask your health care provider if you are at risk. Ask your health care provider about whether you are at high risk for HIV. Your health care provider may recommend a prescription medicine   to help prevent HIV infection. If you choose to take medicine to prevent HIV, you should first get tested for HIV. You should then be tested every 3 months for as long as you are taking the medicine. Pregnancy If you are about to stop having your period (premenopausal) and you may become pregnant, seek counseling before you get  pregnant. Take 400 to 800 micrograms (mcg) of folic acid every day if you become pregnant. Ask for birth control (contraception) if you want to prevent pregnancy. Osteoporosis and menopause Osteoporosis is a disease in which the bones lose minerals and strength with aging. This can result in bone fractures. If you are 65 years old or older, or if you are at risk for osteoporosis and fractures, ask your health care provider if you should: Be screened for bone loss. Take a calcium or vitamin D supplement to lower your risk of fractures. Be given hormone replacement therapy (HRT) to treat symptoms of menopause. Follow these instructions at home: Lifestyle Do not use any products that contain nicotine or tobacco, such as cigarettes, e-cigarettes, and chewing tobacco. If you need help quitting, ask your health care provider. Do not use street drugs. Do not share needles. Ask your health care provider for help if you need support or information about quitting drugs. Alcohol use Do not drink alcohol if: Your health care provider tells you not to drink. You are pregnant, may be pregnant, or are planning to become pregnant. If you drink alcohol: Limit how much you use to 0-1 drink a day. Limit intake if you are breastfeeding. Be aware of how much alcohol is in your drink. In the [place] one drink equals one 12 oz bottle of beer (355 mL), one 5 oz glass of wine (148 mL), or one 1 oz glass of hard liquor (44 mL). General instructions Schedule regular health, dental, and eye exams. Stay current with your vaccines. Tell your health care provider if: You often feel depressed. You have ever been abused or do not feel safe at home. Summary Adopting a healthy lifestyle and getting preventive care are important in promoting health and wellness. Follow your health care provider's instructions about healthy diet, exercising, and getting tested or screened for diseases. Follow your health care provider's  instructions on monitoring your cholesterol and blood pressure. This information is not intended to replace advice given to you by your health care provider. Make sure you discuss any questions you have with your health care provider. Document Revised: 08/09/2020 Document Reviewed: 05/25/2018 [employer] Patient Education  2022 [employer]  

## 2021-04-15 NOTE — Progress Notes (Signed)
Rhonda Wood 50 y.o.   Chief Complaint  Patient presents with   New Patient (Initial Visit)    Pt would like to discuss weight management and acid reflux    HISTORY OF PRESENT ILLNESS: This is a 50 y.o. female first visit to this office here to establish care with me General health self grade: C+ due to nutrition and weight issues, lack of exercise, lack of vitamins, IBS, and family history of diabetes. Non-smoker. No chronic medical problems.  No chronic medications. Has a history of IBS.  Diet controlled. Sees gynecologist on a regular basis.  No problems.  HPI   Prior to Admission medications   Medication Sig Start Date End Date Taking? Authorizing Provider  cholecalciferol (VITAMIN D3) 25 MCG (1000 UNIT) tablet Take 1,000 Units by mouth daily.   Yes [provider]  vitamin B-12 (CYANOCOBALAMIN) 500 MCG tablet Take 500 mcg by mouth daily.   Yes [provider]  dicyclomine (BENTYL) 20 MG tablet Take 1 tablet (20 mg total) by mouth every 8 (eight) hours as needed for spasms (Abdominal cramping). Patient not taking: Reported on 04/15/2021 10/22/18   Ward, Delice Bison, DO  doxycycline (VIBRA-TABS) 100 MG tablet Take 1 tablet (100 mg total) by mouth 2 (two) times daily. Patient not taking: Reported on 04/15/2021 11/14/18   Robyn Haber, MD  methylPREDNISolone (MEDROL DOSEPAK) 4 MG TBPK tablet Per box instructions Patient not taking: Reported on 04/15/2021 10/07/20   Augusto Gamble B, NP  ondansetron (ZOFRAN ODT) 4 MG disintegrating tablet Take 1 tablet (4 mg total) by mouth every 6 (six) hours as needed. Patient not taking: Reported on 04/15/2021 11/14/18   Robyn Haber, MD  orphenadrine (NORFLEX) 100 MG tablet Take 1 tablet (100 mg total) by mouth 2 (two) times daily as needed for muscle spasms. Patient not taking: Reported on 04/15/2021 10/07/20   Zigmund Gottron, NP  traMADol (ULTRAM) 50 MG tablet Take 1 tablet (50 mg total) by mouth every 6 (six) hours as needed  for severe pain. Patient not taking: Reported on 04/15/2021 11/02/18   Wieters, Madelynn Done C, PA-C    No Known Allergies  There are no problems to display for this patient.   Past Medical History:  Diagnosis Date   IBS (irritable bowel syndrome) 2018    Past Surgical History:  Procedure Laterality Date   BACK SURGERY  11/11/2020    Social History   Socioeconomic History   Marital status: Single    Spouse name: Not on file   Number of children: Not on file   Years of education: Not on file   Highest education level: Not on file  Occupational History   Not on file  Tobacco Use   Smoking status: Never   Smokeless tobacco: Never  Substance and Sexual Activity   Alcohol use: Never   Drug use: Never   Sexual activity: Not on file  Other Topics Concern   Not on file  Social History Narrative   Not on file   Social Determinants of Health   Financial Resource Strain: Not on file  Food Insecurity: Not on file  Transportation Needs: Not on file  Physical Activity: Not on file  Stress: Not on file  Social Connections: Not on file  Intimate Partner Violence: Not on file    History reviewed. No pertinent family history.   Review of Systems  Constitutional: Negative.  Negative for chills, fever and weight loss.  HENT: Negative.  Negative for congestion and sore throat.  Respiratory: Negative.  Negative for cough and shortness of breath.   Cardiovascular: Negative.  Negative for chest pain and palpitations.  Gastrointestinal:  Positive for abdominal pain, constipation and diarrhea. Negative for blood in stool, melena, nausea and vomiting.  Genitourinary: Negative.  Negative for dysuria and hematuria.  Musculoskeletal: Negative.  Negative for back pain.  Skin: Negative.  Negative for rash.  Neurological: Negative.  Negative for dizziness and headaches.  All other systems reviewed and are negative.   Physical Exam Vitals reviewed.  Constitutional:      Appearance:  Normal appearance.  HENT:     Right Ear: Tympanic membrane, ear canal and external ear normal.     Left Ear: Tympanic membrane, ear canal and external ear normal.     Mouth/Throat:     Mouth: Mucous membranes are moist.     Pharynx: Oropharynx is clear.  Eyes:     Extraocular Movements: Extraocular movements intact.     Conjunctiva/sclera: Conjunctivae normal.     Pupils: Pupils are equal, round, and reactive to light.  Cardiovascular:     Rate and Rhythm: Normal rate and regular rhythm.     Pulses: Normal pulses.     Heart sounds: Normal heart sounds.  Pulmonary:     Effort: Pulmonary effort is normal.     Breath sounds: Normal breath sounds.  Musculoskeletal:     Cervical back: Normal range of motion and neck supple. No tenderness.     Right lower leg: No edema.     Left lower leg: No edema.  Lymphadenopathy:     Cervical: No cervical adenopathy.  Skin:    General: Skin is warm and dry.     Capillary Refill: Capillary refill takes less than 2 seconds.  Neurological:     General: No focal deficit present.     Mental Status: She is alert and oriented to person, place, and time.  Psychiatric:        Mood and Affect: Mood normal.        Behavior: Behavior normal.     ASSESSMENT & PLAN: Problem List Items Addressed This Visit       Other   History of IBS   Other Visit Diagnoses     Routine general medical examination at a health care facility    -  Primary   Encounter to establish care       Family history of diabetes mellitus (DM)       Relevant Orders   Hemoglobin A1c   Screening for lipoid disorders       Relevant Orders   Lipid panel   Screening for endocrine, nutritional, metabolic and immunity disorder       Colon cancer screening       Relevant Orders   Ambulatory referral to Gastroenterology   Screening for deficiency anemia       Relevant Orders   CBC with Differential/Platelet   Screening for endocrine, metabolic and immunity disorder       Relevant  Orders   Comprehensive metabolic panel   Hemoglobin A1c      Modifiable risk factors discussed with patient. Anticipatory guidance according to age provided. The following topics were also discussed: Social Determinants of Health Smoking.  Non-smoker Diet and nutrition and need to decrease amount of daily carbohydrate intake Benefits of exercise Cancer screening and need for colon cancer screening with colonoscopy Vaccinations recommendations Cardiovascular risk assessment IBS management and diet Mental health including depression and anxiety Fall and accident  prevention  Patient Instructions  Health Maintenance, Female Adopting a healthy lifestyle and getting preventive care are important in promoting health and wellness. Ask your health care provider about: The right schedule for you to have regular tests and exams. Things you can do on your own to prevent diseases and keep yourself healthy. What should I know about diet, weight, and exercise? Eat a healthy diet  Eat a diet that includes plenty of vegetables, fruits, low-fat dairy products, and lean protein. Do not eat a lot of foods that are high in solid fats, added sugars, or sodium. Maintain a healthy weight Body mass index (BMI) is used to identify weight problems. It estimates body fat based on height and weight. Your health care provider can help determine your BMI and help you achieve or maintain a healthy weight. Get regular exercise Get regular exercise. This is one of the most important things you can do for your health. Most adults should: Exercise for at least 150 minutes each week. The exercise should increase your heart rate and make you sweat (moderate-intensity exercise). Do strengthening exercises at least twice a week. This is in addition to the moderate-intensity exercise. Spend less time sitting. Even light physical activity can be beneficial. Watch cholesterol and blood lipids Have your blood tested for  lipids and cholesterol at 49 years of age, then have this test every 5 years. Have your cholesterol levels checked more often if: Your lipid or cholesterol levels are high. You are older than 49 years of age. You are at high risk for heart disease. What should I know about cancer screening? Depending on your health history and family history, you may need to have cancer screening at various ages. This may include screening for: Breast cancer. Cervical cancer. Colorectal cancer. Skin cancer. Lung cancer. What should I know about heart disease, diabetes, and high blood pressure? Blood pressure and heart disease High blood pressure causes heart disease and increases the risk of stroke. This is more likely to develop in people who have high blood pressure readings, are of African descent, or are overweight. Have your blood pressure checked: Every 3-5 years if you are 55-1 years of age. Every year if you are 83 years old or older. Diabetes Have regular diabetes screenings. This checks your fasting blood sugar level. Have the screening done: Once every three years after age 70 if you are at a normal weight and have a low risk for diabetes. More often and at a younger age if you are overweight or have a high risk for diabetes. What should I know about preventing infection? Hepatitis B If you have a higher risk for hepatitis B, you should be screened for this virus. Talk with your health care provider to find out if you are at risk for hepatitis B infection. Hepatitis C Testing is recommended for: Everyone born from [birth year] through 1964. Anyone with known risk factors for hepatitis C. Sexually transmitted infections (STIs) Get screened for STIs, including gonorrhea and chlamydia, if: You are sexually active and are younger than 49 years of age. You are older than 49 years of age and your health care provider tells you that you are at risk for this type of infection. Your sexual activity has  changed since you were last screened, and you are at increased risk for chlamydia or gonorrhea. Ask your health care provider if you are at risk. Ask your health care provider about whether you are at high risk for HIV. Your health care  provider may recommend a prescription medicine to help prevent HIV infection. If you choose to take medicine to prevent HIV, you should first get tested for HIV. You should then be tested every 3 months for as long as you are taking the medicine. Pregnancy If you are about to stop having your period (premenopausal) and you may become pregnant, seek counseling before you get pregnant. Take 400 to 800 micrograms (mcg) of folic acid every day if you become pregnant. Ask for birth control (contraception) if you want to prevent pregnancy. Osteoporosis and menopause Osteoporosis is a disease in which the bones lose minerals and strength with aging. This can result in bone fractures. If you are 85 years old or older, or if you are at risk for osteoporosis and fractures, ask your health care provider if you should: Be screened for bone loss. Take a calcium or vitamin D supplement to lower your risk of fractures. Be given hormone replacement therapy (HRT) to treat symptoms of menopause. Follow these instructions at home: Lifestyle Do not use any products that contain nicotine or tobacco, such as cigarettes, e-cigarettes, and chewing tobacco. If you need help quitting, ask your health care provider. Do not use street drugs. Do not share needles. Ask your health care provider for help if you need support or information about quitting drugs. Alcohol use Do not drink alcohol if: Your health care provider tells you not to drink. You are pregnant, may be pregnant, or are planning to become pregnant. If you drink alcohol: Limit how much you use to 0-1 drink a day. Limit intake if you are breastfeeding. Be aware of how much alcohol is in your drink. In the [place] one drink  equals one 12 oz bottle of beer (355 mL), one 5 oz glass of wine (148 mL), or one 1 oz glass of hard liquor (44 mL). General instructions Schedule regular health, dental, and eye exams. Stay current with your vaccines. Tell your health care provider if: You often feel depressed. You have ever been abused or do not feel safe at home. Summary Adopting a healthy lifestyle and getting preventive care are important in promoting health and wellness. Follow your health care provider's instructions about healthy diet, exercising, and getting tested or screened for diseases. Follow your health care provider's instructions on monitoring your cholesterol and blood pressure. This information is not intended to replace advice given to you by your health care provider. Make sure you discuss any questions you have with your health care provider. Document Revised: 08/09/2020 Document Reviewed: 05/25/2018 Elsevier Patient Education  2022 [place], MD [facility]

## 2021-06-02 ENCOUNTER — Ambulatory Visit: Payer: Managed Care, Other (non HMO) | Admitting: Emergency Medicine

## 2021-07-31 LAB — RESULTS CONSOLE HPV: CHL HPV: NEGATIVE

## 2021-07-31 LAB — HM PAP SMEAR: HM Pap smear: NEGATIVE

## 2021-07-31 LAB — HM MAMMOGRAPHY

## 2022-06-15 DIAGNOSIS — U071 COVID-19: Secondary | ICD-10-CM

## 2022-06-15 HISTORY — DX: COVID-19: U07.1

## 2022-10-29 ENCOUNTER — Telehealth: Payer: Self-pay | Admitting: Emergency Medicine

## 2022-10-29 NOTE — Telephone Encounter (Signed)
Okay with me. Thanks 

## 2022-10-29 NOTE — Telephone Encounter (Unsigned)
Pt would like to tranfers from [facility] Dr Alvy Bimler to [place]. Please advise.

## 2022-11-05 ENCOUNTER — Ambulatory Visit: Payer: Managed Care, Other (non HMO) | Admitting: Physician Assistant

## 2022-11-05 ENCOUNTER — Encounter: Payer: Self-pay | Admitting: Physician Assistant

## 2022-11-05 VITALS — BP 114/74 | HR 69 | Temp 97.3°F | Ht 66.54 in | Wt 175.6 lb

## 2022-11-05 DIAGNOSIS — Z23 Encounter for immunization: Secondary | ICD-10-CM | POA: Diagnosis not present

## 2022-11-05 DIAGNOSIS — K582 Mixed irritable bowel syndrome: Secondary | ICD-10-CM | POA: Insufficient documentation

## 2022-11-05 DIAGNOSIS — Z1211 Encounter for screening for malignant neoplasm of colon: Secondary | ICD-10-CM

## 2022-11-05 DIAGNOSIS — L918 Other hypertrophic disorders of the skin: Secondary | ICD-10-CM | POA: Diagnosis not present

## 2022-11-05 DIAGNOSIS — F419 Anxiety disorder, unspecified: Secondary | ICD-10-CM | POA: Insufficient documentation

## 2022-11-05 NOTE — Progress Notes (Signed)
Subjective:    Patient ID: Rhonda Wood, female    DOB: 10/30/1970, 52 y.o.   MRN: [id number]  Chief Complaint  Patient presents with   Transitions Of Care    Former [facility] pt in office to establish care with new PCP; pt wants to discuss referral to dermatology to remove skin tags under arm; also recently changed diet due to possible IBS issues;     HPI 51 y.o. patient presents today for new patient establishment with me.  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   Current Care Team: GYN - once yearly mammograms due to dense tissue   Concerns: Mixed IBS - thinks it's related to anxiety; it's been awhile since she's seen GI. Never has had a colonoscopy. No blood in stool. No fevers or night sweats. No other acute concerns or changes.  Skin tags  Anxiety - doing meditation, exercise, searching for counselor   Past Medical History:  Diagnosis Date   COVID-19 2024   occasional cough lingering   GERD (gastroesophageal reflux disease)    IBS (irritable bowel syndrome) 2018    Past Surgical History:  Procedure Laterality Date   BACK SURGERY  11/11/2020   Lower spine - minor per patient    Family History  Problem Relation Age of Onset   Hypertension Mother    Hypertension Father    Dementia Maternal Grandmother    Dementia Paternal Grandmother     Social History   Tobacco Use   Smoking status: Never   Smokeless tobacco: Never  Substance Use Topics   Alcohol use: Never   Drug use: Never     Allergies  Allergen Reactions   Egg-Derived Products    Food    Lactose    Peanut-Containing Drug Products    Tomato     Review of Systems NEGATIVE UNLESS OTHERWISE INDICATED IN HPI      Objective:     BP 114/74 (BP Location: Left Arm)   Pulse 69   Temp (!) 97.3 F (36.3 C) (Temporal)   Ht 5' 6.54" (1.69 m)   Wt 175 lb 9.6 oz (79.7 kg)   SpO2 99%   BMI 27.89 kg/m   Wt Readings from Last 3 Encounters:  11/05/22 175 lb 9.6 oz (79.7 kg)  04/15/21 170 lb  (77.1 kg)  11/14/18 165 lb (74.8 kg)    BP Readings from Last 3 Encounters:  11/05/22 114/74  04/15/21 112/60  10/07/20 133/82     Physical Exam Vitals and nursing note reviewed.  Constitutional:      Appearance: Normal appearance.  Eyes:     Extraocular Movements: Extraocular movements intact.     Conjunctiva/sclera: Conjunctivae normal.     Pupils: Pupils are equal, round, and reactive to light.  Cardiovascular:     Rate and Rhythm: Normal rate and regular rhythm.     Pulses: Normal pulses.     Heart sounds: No murmur heard. Pulmonary:     Effort: Pulmonary effort is normal.     Breath sounds: Normal breath sounds.  Skin:    Comments: Several G[place]iano skin tags left axilla  Neurological:     General: No focal deficit present.     Mental Status: She is alert and oriented to person, place, and time.  Psychiatric:        Mood and Affect: Mood normal.        Behavior: Behavior normal.        Assessment & Plan:  Irritable bowel syndrome  with both constipation and diarrhea Assessment & Plan: Would like to f/up with GI - needs colonoscopy - referral sent today  Orders: -     Ambulatory referral to Gastroenterology  Multiple acquired skin tags Assessment & Plan: Consider cryotherapy in our office, benign / [place] skin tags    Anxiety Assessment & Plan: Secondary to life stress. Finishing MBA. Parents aging.  Requesting counseling - referral sent today.   Orders: -     Ambulatory referral to Psychology  Need for prophylactic vaccination and inoculation against varicella -     Varicella-zoster vaccine IM  Encounter for screening colonoscopy -     Ambulatory referral to Gastroenterology        Return in about 6 months (around 05/08/2023) for physical, fasting labs .   [place] [facility] Ethelyne Erich, PA-C

## 2022-11-05 NOTE — Assessment & Plan Note (Signed)
Would like to f/up with GI - needs colonoscopy - referral sent today

## 2022-11-05 NOTE — Addendum Note (Signed)
Addended by: Ila Mcgill on: 11/05/2022 09:52 AM   Modules accepted: Level of Service

## 2022-11-05 NOTE — Assessment & Plan Note (Signed)
Consider cryotherapy in our office, benign / [place] skin tags

## 2022-11-05 NOTE — Assessment & Plan Note (Signed)
Secondary to life stress. Finishing MBA. Parents aging.  Requesting counseling - referral sent today.

## 2022-11-05 NOTE — Patient Instructions (Signed)
Welcome to [facility]! It was a pleasure meeting you today.  As discussed, Please schedule a 4-6 month follow up visit today for physical and fasting labs.  Please call [facility] to schedule your colonoscopy. [phone]. Referral sent today.  Referral to see Dr. Monna Fam for counseling at our office.  17003 is procedure code for #2-14 skin tag removal - schedule if this is something you'd like set up.   PLEASE NOTE:  If you had any LAB tests please let us know if you have not heard back within a few days. You may see your results on MyChart before we have a chance to review them but we will give you a call once they are reviewed by [place]. If we ordered any REFERRALS today, please let us know if you have not heard from their office within the next two weeks. Let us know through MyChart if you are needing REFILLS, or have your pharmacy send [place] the request. You can also use MyChart to communicate with me or any office staff.  Please try these tips to maintain a healthy lifestyle:  Eat most of your calories during the day when you are active. Eliminate processed foods including packaged sweets (pies, cakes, cookies), reduce intake of potatoes, white bread, white pasta, and white rice. Look for whole grain options, oat flour or almond flour.  Each meal should contain half fruits/vegetables, one quarter protein, and one quarter carbs (no bigger than a computer mouse).  Cut down on sweet beverages. This includes juice, soda, and sweet tea. Also watch fruit intake, though this is a healthier sweet option, it still contains natural sugar! Limit to 3 servings daily.  Drink at least 1 glass of water with each meal and aim for at least 8 glasses (64 ounces) per day.  Exercise at least 150 minutes every week to the best of your ability.    Take Care,  Harutyun Monteverde, PA-C

## 2022-11-12 ENCOUNTER — Encounter: Payer: Self-pay | Admitting: Gastroenterology

## 2022-11-16 ENCOUNTER — Encounter: Payer: Self-pay | Admitting: Physician Assistant

## 2022-11-26 ENCOUNTER — Ambulatory Visit (AMBULATORY_SURGERY_CENTER): Payer: Managed Care, Other (non HMO)

## 2022-11-26 VITALS — Ht 66.5 in | Wt 175.0 lb

## 2022-11-26 DIAGNOSIS — Z1211 Encounter for screening for malignant neoplasm of colon: Secondary | ICD-10-CM

## 2022-11-26 MED ORDER — NA SULFATE-K SULFATE-MG SULF 17.5-3.13-1.6 GM/177ML PO SOLN: 1.0000 | Freq: Once | ORAL | 0 refills | Status: AC

## 2022-11-26 NOTE — Progress Notes (Signed)

## 2022-12-02 ENCOUNTER — Other Ambulatory Visit: Payer: Self-pay

## 2022-12-02 ENCOUNTER — Telehealth: Payer: Self-pay | Admitting: Gastroenterology

## 2022-12-02 ENCOUNTER — Encounter: Payer: Self-pay | Admitting: Gastroenterology

## 2022-12-02 DIAGNOSIS — Z1211 Encounter for screening for malignant neoplasm of colon: Secondary | ICD-10-CM

## 2022-12-02 MED ORDER — ONDANSETRON HCL 4 MG PO TABS: 4.0000 mg | ORAL_TABLET | ORAL | 0 refills | Status: DC

## 2022-12-02 NOTE — Telephone Encounter (Signed)
Inbound call from patient requesting a anti nausea medication to be sent in to her pharmacy to help with her prep medication for 7/12 colonoscopy. Requesting a call back. Please advise, thank you.

## 2022-12-02 NOTE — Telephone Encounter (Signed)
Rx sent pt made aware 

## 2022-12-23 ENCOUNTER — Encounter: Payer: Self-pay | Admitting: Certified Registered Nurse Anesthetist

## 2022-12-25 ENCOUNTER — Encounter: Payer: Self-pay | Admitting: Gastroenterology

## 2022-12-25 ENCOUNTER — Ambulatory Visit: Payer: Managed Care, Other (non HMO) | Admitting: Gastroenterology

## 2022-12-25 VITALS — BP 113/73 | HR 67 | Temp 98.0°F | Resp 10 | Ht 66.0 in | Wt 175.0 lb

## 2022-12-25 DIAGNOSIS — D122 Benign neoplasm of ascending colon: Secondary | ICD-10-CM

## 2022-12-25 DIAGNOSIS — Z1211 Encounter for screening for malignant neoplasm of colon: Secondary | ICD-10-CM | POA: Diagnosis not present

## 2022-12-25 MED ORDER — SODIUM CHLORIDE 0.9 % IV SOLN: 500.0000 mL | Freq: Once | INTRAVENOUS | Status: DC

## 2022-12-25 NOTE — Progress Notes (Signed)
Called to room to assist during endoscopic procedure.  Patient ID and intended procedure confirmed with present staff. Received instructions for my participation in the procedure from the performing physician.  

## 2022-12-25 NOTE — Progress Notes (Signed)
History and Physical:  This patient presents for endoscopic testing for: Encounter Diagnosis  Name Primary?   Special screening for malignant neoplasms, colon Yes    Paternal GF had CRC.  Father with colon polyps. First screening exam Patient denies chronic abdominal pain, rectal bleeding, constipation or diarrhea.   Patient is otherwise without complaints or active issues today.   Past Medical History: Past Medical History:  Diagnosis Date   COVID-19 2024   occasional cough lingering   GERD (gastroesophageal reflux disease)    IBS (irritable bowel syndrome) 2018     Past Surgical History: Past Surgical History:  Procedure Laterality Date   BACK SURGERY  11/11/2020   Lower spine - minor per patient    Allergies: Allergies  Allergen Reactions   Egg-Derived Products    Flavoring Agent    Food    Lactose    Peanut-Containing Drug Products    Tomato    Soy Allergy Other (See Comments)    lethargic    Outpatient Meds: Current Outpatient Medications  Medication Sig Dispense Refill   albuterol (VENTOLIN HFA) 108 (90 Base) MCG/ACT inhaler SMARTSIG:1-2 Puff(s) Via Inhaler Every 4-6 Hours PRN (Patient not taking: Reported on 11/26/2022)     cholecalciferol (VITAMIN D3) 25 MCG (1000 UNIT) tablet Take 1,000 Units by mouth daily.     famotidine (PEPCID) 20 MG tablet Take 20 mg by mouth 2 (two) times daily.     ondansetron (ZOFRAN) 4 MG tablet Take 1 tablet (4 mg total) by mouth as directed. 2 tablet 0   vitamin B-12 (CYANOCOBALAMIN) 500 MCG tablet Take 500 mcg by mouth daily.     Current Facility-Administered Medications  Medication Dose Route Frequency Provider Last Rate Last Admin   0.9 %  sodium chloride infusion  500 mL Intravenous Once Sherrilyn Rist, MD          ___________________________________________________________________ Objective   Exam:  BP 120/77   Pulse 69   Temp 98 F (36.7 C)   Ht 5\' 6"  (1.676 m)   Wt 175 lb (79.4 kg)   SpO2 100%    BMI 28.25 kg/m   CV: regular , S1/S2 Resp: clear to auscultation bilaterally, normal RR and effort noted GI: soft, no tenderness, with active bowel sounds.   Assessment: Encounter Diagnosis  Name Primary?   Special screening for malignant neoplasms, colon Yes     Plan: Colonoscopy   The benefits and risks of the planned procedure were described in detail with the patient or (when appropriate) their health care proxy.  Risks were outlined as including, but not limited to, bleeding, infection, perforation, adverse medication reaction leading to cardiac or pulmonary decompensation, pancreatitis (if ERCP).  The limitation of incomplete mucosal visualization was also discussed.  No guarantees or warranties were given.  The patient is appropriate for an endoscopic procedure in the ambulatory setting.   - Rhonda Jupiter, MD

## 2022-12-25 NOTE — Progress Notes (Signed)
Pt's states no medical or surgical changes since previsit or office visit. 

## 2022-12-25 NOTE — Op Note (Signed)
[facility] Patient Name: Rhonda Wood Procedure Date: 12/25/2022 8:29 AM MRN: [id number] Endoscopist: Sherilyn Cooter L. Myrtie Neither , MD, [id number] Age: 52 Referring MD:  Date of Birth: 1971/01/08 Gender: Female Account #: [id number] Procedure:                Colonoscopy Indications:              Screening for colorectal malignant neoplasm, This                            is the patient's first colonoscopy Medicines:                Monitored Anesthesia Care Procedure:                Pre-Anesthesia Assessment:                           - Prior to the procedure, a History and Physical                            was performed, and patient medications and                            allergies were reviewed. The patient's tolerance of                            previous anesthesia was also reviewed. The risks                            and benefits of the procedure and the sedation                            options and risks were discussed with the patient.                            All questions were answered, and informed consent                            was obtained. Prior Anticoagulants: The patient has                            taken no anticoagulant or antiplatelet agents. ASA                            Grade Assessment: II - A patient with mild systemic                            disease. After reviewing the risks and benefits,                            the patient was deemed in satisfactory condition to                            undergo the procedure.  After obtaining informed consent, the colonoscope                            was passed under direct vision. Throughout the                            procedure, the patient's blood pressure, pulse, and                            oxygen saturations were monitored continuously. The                            Olympus CF-HQ190L ([id number]) Colonoscope was                            introduced through the  anus and advanced to the the                            cecum, identified by appendiceal orifice and                            ileocecal valve. The colonoscopy was somewhat                            difficult due to a redundant colon. Successful                            completion of the procedure was aided by using                            manual pressure and straightening and shortening                            the scope to obtain bowel loop reduction. The                            patient tolerated the procedure well. The quality                            of the bowel preparation was excellent. The                            ileocecal valve, appendiceal orifice, and rectum                            were photographed. The bowel preparation used was                            SUPREP via split dose instruction. Scope In: 8:44:56 AM Scope Out: 8:58:29 AM Scope Withdrawal Time: 0 hours 10 minutes 4 seconds  Total Procedure Duration: 0 hours 13 minutes 33 seconds  Findings:                 The perianal and digital rectal examinations were  normal.                           A 4 mm polyp was found in the ascending colon. The                            polyp was sessile. The polyp was removed with a                            cold snare. Resection and retrieval were complete.                           Repeat examination of right colon under NBI                            performed.                           The exam was otherwise without abnormality on                            direct and retroflexion views. Complications:            No immediate complications. Estimated Blood Loss:     Estimated blood loss was minimal. Impression:               - One 4 mm polyp in the ascending colon, removed                            with a cold snare. Resected and retrieved.                           - The examination was otherwise normal on direct                             and retroflexion views. Recommendation:           - Patient has a contact number available for                            emergencies. The signs and symptoms of potential                            delayed complications were discussed with the                            patient. Return to normal activities tomorrow.                            Written discharge instructions were provided to the                            patient.                           - Resume previous diet.                           -  Continue present medications.                           - Await pathology results.                           - Repeat colonoscopy is recommended for                            surveillance. The colonoscopy date will be                            determined after pathology results from today's                            exam become available for review. Raini Tiley L. Myrtie Neither, MD 12/25/2022 9:01:42 AM This report has been signed electronically.

## 2022-12-25 NOTE — Progress Notes (Signed)
Report given to PACU, vss 

## 2022-12-25 NOTE — Patient Instructions (Addendum)
Recommendation:           - Patient has a contact number available for                            emergencies. The signs and symptoms of potential                            delayed complications were discussed with the                            patient. Return to normal activities tomorrow.                            Written discharge instructions were provided to the                            patient.                           - Resume previous diet.                           - Continue present medications.                           - Await pathology results.                           - Repeat colonoscopy is recommended for                            surveillance. The colonoscopy date will be                            determined after pathology results from today's                            exam become available for review.  Handout on polyps given.  YOU HAD AN ENDOSCOPIC PROCEDURE TODAY AT THE [facility]:   Refer to the procedure report that was given to you for any specific questions about what was found during the examination.  If the procedure report does not answer your questions, please call your gastroenterologist to clarify.  If you requested that your care partner not be given the details of your procedure findings, then the procedure report has been included in a sealed envelope for you to review at your convenience later.  YOU SHOULD EXPECT: Some feelings of bloating in the abdomen. Passage of more gas than usual.  Walking can help get rid of the air that was put into your GI tract during the procedure and reduce the bloating. If you had a lower endoscopy (such as a colonoscopy or flexible sigmoidoscopy) you may notice spotting of blood in your stool or on the toilet paper. If you underwent a bowel prep for your procedure, you may not have a normal bowel movement for a few days.  Please Note:  You might notice some irritation and congestion in your nose or some  drainage.  This is from the oxygen used during your procedure.  There is no need for concern and it should clear up in a day or so.  SYMPTOMS TO REPORT IMMEDIATELY:  Following lower endoscopy (colonoscopy or flexible sigmoidoscopy):  Excessive amounts of blood in the stool  Significant tenderness or worsening of abdominal pains  Swelling of the abdomen that is new, acute  Fever of 100F or higher  For urgent or emergent issues, a gastroenterologist can be reached at any hour by calling [phone]. Do not use MyChart messaging for urgent concerns.    DIET:  We do recommend a [place] meal at first, but then you may proceed to your regular diet.  Drink plenty of fluids but you should avoid alcoholic beverages for 24 hours.  ACTIVITY:  You should plan to take it easy for the rest of today and you should NOT DRIVE or use heavy machinery until tomorrow (because of the sedation medicines used during the test).    FOLLOW UP: Our staff will call the number listed on your records the next business day following your procedure.  We will call around 7:15- 8:00 am to check on you and address any questions or concerns that you may have regarding the information given to you following your procedure. If we do not reach you, we will leave a message.     If any biopsies were taken you will be contacted by phone or by letter within the next 1-3 weeks.  Please call us at [phone] if you have not heard about the biopsies in 3 weeks.    SIGNATURES/CONFIDENTIALITY: You and/or your care partner have signed paperwork which will be entered into your electronic medical record.  These signatures attest to the fact that that the information above on your After Visit Summary has been reviewed and is understood.  Full responsibility of the confidentiality of this discharge information lies with you and/or your care-partner.

## 2022-12-28 ENCOUNTER — Telehealth: Payer: Self-pay | Admitting: *Deleted

## 2022-12-28 NOTE — Telephone Encounter (Signed)
 Post procedure follow up phone call. No answer at number given.  Left message on voicemail.  

## 2022-12-29 ENCOUNTER — Encounter: Payer: Self-pay | Admitting: Gastroenterology

## 2023-01-28 ENCOUNTER — Encounter (INDEPENDENT_AMBULATORY_CARE_PROVIDER_SITE_OTHER): Payer: Self-pay

## 2023-03-08 ENCOUNTER — Telehealth: Payer: Self-pay | Admitting: Physician Assistant

## 2023-03-08 ENCOUNTER — Ambulatory Visit: Payer: Managed Care, Other (non HMO) | Admitting: Physician Assistant

## 2023-03-08 NOTE — Telephone Encounter (Signed)
LVM informing pt of no show status from today's visit. I offered pt to call back for reschedule.

## 2023-03-10 ENCOUNTER — Ambulatory Visit (INDEPENDENT_AMBULATORY_CARE_PROVIDER_SITE_OTHER): Payer: Managed Care, Other (non HMO) | Admitting: Physician Assistant

## 2023-03-10 ENCOUNTER — Telehealth: Payer: Self-pay

## 2023-03-10 VITALS — BP 110/68 | HR 71 | Temp 97.5°F | Ht 66.0 in | Wt 178.2 lb

## 2023-03-10 DIAGNOSIS — Z23 Encounter for immunization: Secondary | ICD-10-CM

## 2023-03-10 DIAGNOSIS — L918 Other hypertrophic disorders of the skin: Secondary | ICD-10-CM | POA: Diagnosis not present

## 2023-03-10 DIAGNOSIS — K582 Mixed irritable bowel syndrome: Secondary | ICD-10-CM

## 2023-03-10 DIAGNOSIS — F419 Anxiety disorder, unspecified: Secondary | ICD-10-CM

## 2023-03-10 NOTE — Assessment & Plan Note (Signed)
Chronic, stable, improving with dietary changes

## 2023-03-10 NOTE — Progress Notes (Signed)
Subjective:    Patient ID: Rhonda Wood, female    DOB: 1970/09/04, 52 y.o.   MRN: [id number]  Chief Complaint  Patient presents with   Medical Management of Chronic Issues    Pt last seen in office for IBS syndrome and following up w/ PCP; pt wanting to schedule appt with PCP to remove skin tags or possible dermatology referral    HPI Patient is in today for f/up from previous visit.  Trying to do some meditation and walking, more exercise. Trying to do better with sleep, still about 5-6 hours on average, wakes up during the night. Finances are a big stressor as well as aging parents. Never received paperwork for counselor, but really wants to do this. Took medicine in her 68s, would consider it again but not right now.  Cutting back on sugar intake and avoid cold water to help her IBS Colonoscopy completed - 1 sm polyp, due back in 7 years.   Would like skin tags removed.    Past Medical History:  Diagnosis Date   COVID-19 2024   occasional cough lingering   GERD (gastroesophageal reflux disease)    IBS (irritable bowel syndrome) 2018    Past Surgical History:  Procedure Laterality Date   BACK SURGERY  11/11/2020   Lower spine - minor per patient    Family History  Problem Relation Age of Onset   Colon polyps Mother    Hypertension Mother    Colon polyps Father    Hypertension Father    Dementia Maternal Grandmother    Dementia Paternal Grandmother    Colon cancer Paternal Grandfather    Esophageal cancer Neg Hx    Rectal cancer Neg Hx    Stomach cancer Neg Hx     Social History   Tobacco Use   Smoking status: Never   Smokeless tobacco: Never  Substance Use Topics   Alcohol use: Never   Drug use: Never     Allergies  Allergen Reactions   Egg-Derived Products    Flavoring Agent    Food    Lactose    Peanut-Containing Drug Products    Tomato    Soy Allergy Other (See Comments)    lethargic    Review of Systems NEGATIVE UNLESS OTHERWISE  INDICATED IN HPI      Objective:     BP 110/68 (BP Location: Left Arm)   Pulse 71   Temp (!) 97.5 F (36.4 C) (Temporal)   Ht 5\' 6"  (1.676 m)   Wt 178 lb 3.2 oz (80.8 kg)   SpO2 99%   BMI 28.76 kg/m   Wt Readings from Last 3 Encounters:  03/10/23 178 lb 3.2 oz (80.8 kg)  12/25/22 175 lb (79.4 kg)  11/26/22 175 lb (79.4 kg)    BP Readings from Last 3 Encounters:  03/10/23 110/68  12/25/22 113/73  11/05/22 114/74     Physical Exam Vitals and nursing note reviewed.  Constitutional:      Appearance: Normal appearance.  Cardiovascular:     Rate and Rhythm: Normal rate and regular rhythm.     Pulses: Normal pulses.     Heart sounds: No murmur heard. Pulmonary:     Effort: Pulmonary effort is normal.     Breath sounds: Normal breath sounds.  Skin:    Comments: Benign skin tags, all less than 0.2 mm in size: 1 L neck 7 L axilla 1 L low back 1 r axilla   Neurological:  General: No focal deficit present.     Mental Status: She is alert.  Psychiatric:        Mood and Affect: Mood normal.        Behavior: Behavior normal.        Assessment & Plan:  Anxiety Assessment & Plan: Secondary to life stress. Finishing MBA. Parents aging.  Requesting counseling - referral resent today.  Discussed medication options as well, pt wants to hold on this right now.     03/10/2023   10:10 AM 11/05/2022    8:06 AM  GAD 7 : Generalized Anxiety Score  Nervous, Anxious, on Edge 2 2  Control/stop worrying 3 2  Worry too much - different things 3 2  Trouble relaxing 3 2  Restless 2 2  Easily annoyed or irritable 2 2  Afraid - awful might happen 2 2  Total GAD 7 Score 17 14  Anxiety Difficulty Somewhat difficult Somewhat difficult      Orders: -     Ambulatory referral to Psychology  Multiple acquired skin tags  Irritable bowel syndrome with both constipation and diarrhea Assessment & Plan: Chronic, stable, improving with dietary changes    Immunization due -      Influenza, MDCK, trivalent, PF(Flucelvax egg-free)    Skin tag removal: Procedure explained and verbal consent obtained. Cryotherapy performed on 10 skin tags as described in physical exam. One freeze-thaw cycle of 5 seconds with good ice ring formation and deformation. Pt tolerated well. Aftercare instructions provided.      Return in about 8 months (around 11/07/2023) for physical, fasting labs .    Shilpa Bushee M Jonnette Nuon, PA-C

## 2023-03-10 NOTE — Patient Instructions (Signed)
Please keep in touch how you're doing! Keep up good work with health.

## 2023-03-10 NOTE — Assessment & Plan Note (Signed)
Secondary to life stress. Finishing MBA. Parents aging.  Requesting counseling - referral resent today.  Discussed medication options as well, pt wants to hold on this right now.     03/10/2023   10:10 AM 11/05/2022    8:06 AM  GAD 7 : Generalized Anxiety Score  Nervous, Anxious, on Edge 2 2  Control/stop worrying 3 2  Worry too much - different things 3 2  Trouble relaxing 3 2  Restless 2 2  Easily annoyed or irritable 2 2  Afraid - awful might happen 2 2  Total GAD 7 Score 17 14  Anxiety Difficulty Somewhat difficult Somewhat difficult

## 2023-03-12 NOTE — Telephone Encounter (Signed)
error 

## 2023-03-25 ENCOUNTER — Encounter: Payer: Self-pay | Admitting: Physician Assistant

## 2023-03-29 ENCOUNTER — Ambulatory Visit (INDEPENDENT_AMBULATORY_CARE_PROVIDER_SITE_OTHER): Payer: 59 | Admitting: Psychology

## 2023-03-29 DIAGNOSIS — F330 Major depressive disorder, recurrent, mild: Secondary | ICD-10-CM

## 2023-03-29 DIAGNOSIS — F411 Generalized anxiety disorder: Secondary | ICD-10-CM | POA: Diagnosis not present

## 2023-03-29 NOTE — Progress Notes (Signed)
Rhonda Wood, [facility]
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
## 2023-04-13 ENCOUNTER — Ambulatory Visit (INDEPENDENT_AMBULATORY_CARE_PROVIDER_SITE_OTHER): Payer: 59 | Admitting: Psychology

## 2023-04-13 DIAGNOSIS — F330 Major depressive disorder, recurrent, mild: Secondary | ICD-10-CM | POA: Diagnosis not present

## 2023-04-13 DIAGNOSIS — F411 Generalized anxiety disorder: Secondary | ICD-10-CM

## 2023-04-13 NOTE — Progress Notes (Signed)
[facility] Counselor/Therapist Progress Note  Patient ID: Rhonda Wood, MRN: [id number],    Date: 04/13/2023  Time Spent: 8:00am-8:57am   Treatment Type: Individual Therapy   pt is seen of virtual video visit via caregility. 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 Pt consents to virtual visit and is aware of limitations of such visits.   Reported Symptoms: positive of getting out of house, planning for self care and social interactions.  Feeling less overwhelmed that major school assignment complete and should be graduating in next month.    Mental Status Exam: Appearance:  Well Groomed     Behavior: Appropriate  Motor: Normal  Speech/Language:  Clear and Coherent  Affect: Appropriate  Mood: anxious and depressed  Thought process: normal  Thought content:   WNL  Sensory/Perceptual disturbances:   WNL  Orientation: oriented to person, place, time/date, and situation  Attention: Good  Concentration: Good  Memory: WNL  Fund of knowledge:  Good  Insight:   Good  Judgment:  Good  Impulse Control: Good   Risk Assessment: Danger to Self:  No Self-injurious Behavior: No Danger to Others: No Duty to Warn:no Physical Aggression / Violence:No  Access to Firearms a concern: No  Gang Involvement:No   Subjective: counselor assessed pt current functioning per pt report.  Processed recent stressors, positives and moods.  Explored positive steps taken w/ her academics and next steps for completion.  Discussed self care and being intentional/planning for grounding/relaxing and socially engaging.  Validated stressors w/ mom's health. Pt affect wnl.  Pt reported that she feels good to have completed major paper and only 2 [employer] assignments left for graduation.  Pt feels less overwhelmed.  Pt reported that she was able to get out of house and some travel for business and felt good to have that break/own space.  Pt did report stress their  w/ some coworkers but managed and has support as well.  Pt discussed next steps for self w/ self care, plans for time w/ sister for fun, planning for lunch w/ friend and focus on finance wellbeing once graduates.     Interventions: Cognitive Behavioral Therapy, Mindfulness Meditation, and Solution-Oriented/Positive Psychology  Diagnosis:Generalized anxiety disorder  MDD (major depressive disorder), recurrent episode, mild (HCC)  Plan: Pt to f/u in 2 weeks for counseling.  Pt to f/u as scheduled w/ PCP.   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 Pt idnetifies that she does better w/ exercise and being active.  Enjoys walking/Hiking.  Pt is considering taking up yoga again.  Pt did Tae kwon do in 20s.  Pt is part of a [name] that meets consistent.  Pt reports in past meditation and breathing work was positive and needs to be back into.    Supports:  friends, sister has stood up for her and care/efforts she is providing to parents.  Pt also reports her mom's friend, Jasmine December, is a support.      Goal/Needs for Treatment:  In order of importance to patient 1) decreased anxiety/depression w/ coping skills 2) improve self talk  3) ---    Client Statement of Needs:  Pt: "dealing w/ the anxiety and depression.  Improve the internal self talk so I can handle things/stressors on outside w/out giving up or avoiding."    Treatment Level:outpatient counseling  Symptoms:anxiety, low self worth/negative self tlak  Client Treatment Preferences:f/u w/ counseling biweekly to monthly.  Healthcare consumer's goal for treatment:   Counselor, Forde Radon, [facility] will support the patient's ability to achieve the goals identified. Cognitive Behavioral Therapy, Assertive Communication/Conflict Resolution Training, Relaxation Training, ACT, Humanistic and other evidenced-based practices will be used to promote progress towards healthy functioning.     Healthcare consumer will: Actively participate in therapy, working towards healthy functioning.     *Justification for Continuation/Discontinuation of Goal: R=Revised, O=Ongoing, A=Achieved, D=Discontinued   Goal 1) Learn and implement coping skills to assist w/ managing stress and reduce anxiety and depressive symptoms per pt report and therapist observation.  Baseline date 03/29/23: Progress towards goal 0; How Often - Daily Target Date Goal Was reviewed Status Code Progress towards goal/Likert rating  03/28/24                            Goal 2) Identify distortions and negative self talk, learn to challenge and reframe AEB Pt report and therapist observation.  Baseline date 03/29/23: Progress towards goal 0; How Often - Daily Target Date Goal Was reviewed Status Code Progress towards goal  03/28/24                                This plan has been reviewed and created by the following participants:  This plan will be reviewed at least every 12 months. Date Behavioral Health Clinician Date Guardian/Patient   03/29/23               [facility] Ophelia Charter [facility] 03/29/23 Verbal Consent Provided                     Forde Radon [facility]

## 2023-04-27 ENCOUNTER — Ambulatory Visit: Payer: 59 | Admitting: Psychology

## 2023-04-27 DIAGNOSIS — F330 Major depressive disorder, recurrent, mild: Secondary | ICD-10-CM | POA: Diagnosis not present

## 2023-04-27 DIAGNOSIS — F411 Generalized anxiety disorder: Secondary | ICD-10-CM

## 2023-04-27 NOTE — Progress Notes (Signed)
[facility] Counselor/Therapist Progress Note  Patient ID: Rhonda Wood, MRN: [id number],    Date: 04/27/2023  Time Spent: 8:03am-9:00am   Treatment Type: Individual Therapy   pt is seen of virtual video visit via caregility. 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 Pt consents to virtual visit and is aware of limitations of such visits.   Reported Symptoms: pt reports less overwhelmed.  Pt reports worry about her financial wellbeing and mom's health.  Pt reports focus on self care.  Mental Status Exam: Appearance:  Well Groomed     Behavior: Appropriate  Motor: Normal  Speech/Language:  Clear and Coherent  Affect: Appropriate  Mood: anxious  Thought process: normal  Thought content:   WNL  Sensory/Perceptual disturbances:   WNL  Orientation: oriented to person, place, time/date, and situation  Attention: Good  Concentration: Good  Memory: WNL  Fund of knowledge:  Good  Insight:   Good  Judgment:  Good  Impulse Control: Good   Risk Assessment: Danger to Self:  No Self-injurious Behavior: No Danger to Others: No Duty to Warn:no Physical Aggression / Violence:No  Access to Firearms a concern: No  Gang Involvement:No   Subjective: Counselor assessed pt current functioning per pt report.  Processed positives and stressors.  Explored positive steps taken for her self care and things in her control.  Discussed continued steps w/ coping w/ unknowns for mom and for self.  Pt affect wnl.  Pt reported that she has filed for her graduation and less overwhelmed.  Pt reports w/ taken this step looking at her next steps for career and worry for her financial wellbeing in future.  Pt discussed worry for mom and recognizing functioning changes continue even w/ improvements.  Pt discussed unknowns and validated emotions.  Pt discussed focus on self care and doing things that make her feel good- increased meditation time, volunteer work,  projects at home.   Interventions: Cognitive Behavioral Therapy, Mindfulness Meditation, and Solution-Oriented/Positive Psychology  Diagnosis:Generalized anxiety disorder  MDD (major depressive disorder), recurrent episode, mild (HCC)  Plan: Pt to f/u in 2 weeks for counseling.  Pt to f/u as scheduled w/ PCP.   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 Pt idnetifies that she does better w/ exercise and being active.  Enjoys walking/Hiking.  Pt is considering taking up yoga again.  Pt did Tae kwon do in 20s.  Pt is part of a [name] that meets consistent.  Pt reports in past meditation and breathing work was positive and needs to be back into.    Supports:  friends, sister has stood up for her and care/efforts she is providing to parents.  Pt also reports her mom's friend, Jasmine December, is a support.      Goal/Needs for Treatment:  In order of importance to patient 1) decreased anxiety/depression w/ coping skills 2) improve self talk  3) ---    Client Statement of Needs:  Pt: "dealing w/ the anxiety and depression.  Improve the internal self talk so I can handle things/stressors on outside w/out giving up or avoiding."    Treatment Level:outpatient counseling  Symptoms:anxiety, low self worth/negative self tlak  Client Treatment Preferences:f/u w/ counseling biweekly to monthly.        Healthcare consumer's goal for treatment:   Counselor, Forde Radon, [facility] will support the patient's ability to achieve the goals identified. Cognitive Behavioral Therapy, Assertive Communication/Conflict Resolution Training, Relaxation Training, ACT,  Humanistic and other evidenced-based practices will be used to promote progress towards healthy functioning.    Healthcare consumer will: Actively participate in therapy, working towards healthy functioning.     *Justification for Continuation/Discontinuation of Goal: R=Revised, O=Ongoing, A=Achieved,  D=Discontinued   Goal 1) Learn and implement coping skills to assist w/ managing stress and reduce anxiety and depressive symptoms per pt report and therapist observation.  Baseline date 03/29/23: Progress towards goal 0; How Often - Daily Target Date Goal Was reviewed Status Code Progress towards goal/Likert rating  03/28/24                            Goal 2) Identify distortions and negative self talk, learn to challenge and reframe AEB Pt report and therapist observation.  Baseline date 03/29/23: Progress towards goal 0; How Often - Daily Target Date Goal Was reviewed Status Code Progress towards goal  03/28/24                                This plan has been reviewed and created by the following participants:  This plan will be reviewed at least every 12 months. Date Behavioral Health Clinician Date Guardian/Patient   03/29/23               [facility] Ophelia Charter [facility] 03/29/23 Verbal Consent Provided                          Forde Radon [facility]

## 2023-05-18 ENCOUNTER — Ambulatory Visit (INDEPENDENT_AMBULATORY_CARE_PROVIDER_SITE_OTHER): Payer: 59 | Admitting: Psychology

## 2023-05-18 DIAGNOSIS — F411 Generalized anxiety disorder: Secondary | ICD-10-CM

## 2023-05-18 DIAGNOSIS — F330 Major depressive disorder, recurrent, mild: Secondary | ICD-10-CM

## 2023-05-18 NOTE — Progress Notes (Signed)
[facility] Counselor/Therapist Progress Note  Patient ID: Sumiyah Pigg, MRN: [id number],    Date: 05/18/2023  Time Spent: 8:00am-9:04am   Treatment Type: Individual Therapy   pt is seen of virtual video visit via caregility. Pt joins from her parked car, reporting privacy, and counselor from her home office. Pt consents to virtual visit and is aware of limitations of such visits.   Reported Symptoms: pt reports worry for parents, stress of feeling in the middle and want to focus on her financial wellbeing.  Mental Status Exam: Appearance:  Well Groomed     Behavior: Appropriate  Motor: Normal  Speech/Language:  Clear and Coherent  Affect: Appropriate  Mood: anxious  Thought process: normal  Thought content:   WNL  Sensory/Perceptual disturbances:   WNL  Orientation: oriented to person, place, time/date, and situation  Attention: Good  Concentration: Good  Memory: WNL  Fund of knowledge:  Good  Insight:   Good  Judgment:  Good  Impulse Control: Good   Risk Assessment: Danger to Self:  No Self-injurious Behavior: No Danger to Others: No Duty to Warn:no Physical Aggression / Violence:No  Access to Firearms a concern: No  Gang Involvement:No   Subjective: Counselor assessed pt current functioning per pt report.  Processed positives and stressors.  Reflected worry for things she cannot impact and healthy boundaries for self.  Discussed asserting boundaries to not be the one to pass on sister's messages.  Explored positive awareness of need to focus on her own goals and managing her stressors.  Discussed next steps pt taking.  Processed relationship w/ mom and how impacted by her parenting as child.  Pt affect wnl.  Pt reported worry for mom and dad. Pt reports dad lost his job.  Pt discussed how sister asking her to communicate things to mom and puts her in middle.  Pt recognized steps can take to assert for self and to focus on her own decisions for self.  Pt  reported that wants to prepare to move out and steps to take towards job that will give financial independence.  Pt discussed relationship w/ mom and past truama growing up and how has impacted and comes up in new ways w/ new stages.  Pt feels good that she is able to talk w/ mom about for resolution.  Interventions: Cognitive Behavioral Therapy, Mindfulness Meditation, and Solution-Oriented/Positive Psychology  Diagnosis:Generalized anxiety disorder  MDD (major depressive disorder), recurrent episode, mild (HCC)  Plan: Pt to f/u in 2 weeks for counseling.  Pt to f/u as scheduled w/ PCP.   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 Pt idnetifies that she does better w/ exercise and being active.  Enjoys walking/Hiking.  Pt is considering taking up yoga again.  Pt did Tae kwon do in 20s.  Pt is part of a [name] that meets consistent.  Pt reports in past meditation and breathing work was positive and needs to be back into.    Supports:  friends, sister has stood up for her and care/efforts she is providing to parents.  Pt also reports her mom's friend, Jasmine December, is a support.      Goal/Needs for Treatment:  In order of importance to patient 1) decreased anxiety/depression w/ coping skills 2) improve self talk  3) ---    Client Statement of Needs:  Pt: "dealing w/ the anxiety and depression.  Improve the internal self talk so I can handle things/stressors on outside w/out giving  up or avoiding."    Treatment Level:outpatient counseling  Symptoms:anxiety, low self worth/negative self tlak  Client Treatment Preferences:f/u w/ counseling biweekly to monthly.        Healthcare consumer's goal for treatment:   Counselor, Forde Radon, [facility] will support the patient's ability to achieve the goals identified. Cognitive Behavioral Therapy, Assertive Communication/Conflict Resolution Training, Relaxation Training, ACT, Humanistic and other  evidenced-based practices will be used to promote progress towards healthy functioning.    Healthcare consumer will: Actively participate in therapy, working towards healthy functioning.     *Justification for Continuation/Discontinuation of Goal: R=Revised, O=Ongoing, A=Achieved, D=Discontinued   Goal 1) Learn and implement coping skills to assist w/ managing stress and reduce anxiety and depressive symptoms per pt report and therapist observation.  Baseline date 03/29/23: Progress towards goal 0; How Often - Daily Target Date Goal Was reviewed Status Code Progress towards goal/Likert rating  03/28/24                            Goal 2) Identify distortions and negative self talk, learn to challenge and reframe AEB Pt report and therapist observation.  Baseline date 03/29/23: Progress towards goal 0; How Often - Daily Target Date Goal Was reviewed Status Code Progress towards goal  03/28/24                                This plan has been reviewed and created by the following participants:  This plan will be reviewed at least every 12 months. Date Behavioral Health Clinician Date Guardian/Patient   03/29/23               [facility] Ophelia Charter [facility] 03/29/23 Verbal Consent Provided                             Forde Radon [facility]

## 2023-06-15 ENCOUNTER — Ambulatory Visit: Payer: 59 | Admitting: Psychology

## 2023-06-15 DIAGNOSIS — F330 Major depressive disorder, recurrent, mild: Secondary | ICD-10-CM

## 2023-06-15 DIAGNOSIS — F411 Generalized anxiety disorder: Secondary | ICD-10-CM

## 2023-06-15 NOTE — Progress Notes (Signed)
 [facility] Counselor/Therapist Progress Note  Patient ID: Rhonda Wood, MRN: [id number],    Date: 06/15/2023  Time Spent: 8:03am-8:58am   Treatment Type: Individual Therapy   pt is seen of virtual video visit via caregility. Pt joins from her parked car at home, reporting privacy, and counselor from her home office. Pt consents to virtual visit and is aware of limitations of such visits.   Reported Symptoms: pt reports positive holidays,  pt reports focus don self care, decisions for financial wellbeing.  Pt reports struggles w/ focus, sustained attention, easily overstimulated.    Mental Status Exam: Appearance:  Well Groomed     Behavior: Appropriate  Motor: Normal  Speech/Language:  Clear and Coherent  Affect: Appropriate  Mood: anxious  Thought process: normal  Thought content:   WNL  Sensory/Perceptual disturbances:   WNL  Orientation: oriented to person, place, time/date, and situation  Attention: Good  Concentration: Good  Memory: WNL  Fund of knowledge:  Good  Insight:   Good  Judgment:  Good  Impulse Control: Good   Risk Assessment: Danger to Self:  No Self-injurious Behavior: No Danger to Others: No Duty to Warn:no Physical Aggression / Violence:No  Access to Firearms a concern: No  Gang Involvement:No   Subjective: Counselor assessed pt current functioning per pt report.  Processed with pt mood and steps for continued coping.  Reflected positive of maintaining boundaries in family system.  Explored step spt is taking for progress with self care and progress towards goals. Pt affect wnl.  Pt reported she had a good Christmas w/family.  Pt reports that she has been able to keep from in middle of mom/sister communication.  Pt reports that mom getting second opinion from neurologist.  Pt reports that she is considering testing for ADHD reporting symptoms that have been present and maintain even w/ improvement in mood.  Pt discussed how focusing on her  self care and wellbeing- making decision for finances and future.    Interventions: Cognitive Behavioral Therapy, Mindfulness Meditation, and Solution-Oriented/Positive Psychology  Diagnosis:Generalized anxiety disorder  MDD (major depressive disorder), recurrent episode, mild (HCC)  Plan: Pt to f/u in 2 weeks for counseling.  Pt to f/u as scheduled w/ PCP.   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 Pt idnetifies that she does better w/ exercise and being active.  Enjoys walking/Hiking.  Pt is considering taking up yoga again.  Pt did Tae kwon do in 20s.  Pt is part of a [name] that meets consistent.  Pt reports in past meditation and breathing work was positive and needs to be back into.    Supports:  friends, sister has stood up for her and care/efforts she is providing to parents.  Pt also reports her mom's friend, Reena, is a support.      Goal/Needs for Treatment:  In order of importance to patient 1) decreased anxiety/depression w/ coping skills 2) improve self talk  3) ---    Client Statement of Needs:  Pt: dealing w/ the anxiety and depression.  Improve the internal self talk so I can handle things/stressors on outside w/out giving up or avoiding.    Treatment Level:outpatient counseling  Symptoms:anxiety, low self worth/negative self tlak  Client Treatment Preferences:f/u w/ counseling biweekly to monthly.        Healthcare consumer's goal for treatment:   Counselor, [name], [facility] will support the patient's ability to achieve the goals identified. Cognitive Behavioral Therapy, Assertive  Communication/Conflict Resolution Training, Management Consultant, ACT, Humanistic and other evidenced-based practices will be used to promote progress towards healthy functioning.    Healthcare consumer will: Actively participate in therapy, working towards healthy functioning.     *Justification for  Continuation/Discontinuation of Goal: R=Revised, O=Ongoing, A=Achieved, D=Discontinued   Goal 1) Learn and implement coping skills to assist w/ managing stress and reduce anxiety and depressive symptoms per pt report and therapist observation.  Baseline date 03/29/23: Progress towards goal 0; How Often - Daily Target Date Goal Was reviewed Status Code Progress towards goal/Likert rating  03/28/24                            Goal 2) Identify distortions and negative self talk, learn to challenge and reframe AEB Pt report and therapist observation.  Baseline date 03/29/23: Progress towards goal 0; How Often - Daily Target Date Goal Was reviewed Status Code Progress towards goal  03/28/24                                This plan has been reviewed and created by the following participants:  This plan will be reviewed at least every 12 months. Date Behavioral Health Clinician Date Guardian/Patient   03/29/23               [facility] 03/29/23 Verbal Consent Provided                            [name] [facility]

## 2023-07-08 ENCOUNTER — Ambulatory Visit: Payer: 59 | Admitting: Psychology

## 2023-07-08 DIAGNOSIS — F411 Generalized anxiety disorder: Secondary | ICD-10-CM | POA: Diagnosis not present

## 2023-07-08 DIAGNOSIS — F330 Major depressive disorder, recurrent, mild: Secondary | ICD-10-CM | POA: Diagnosis not present

## 2023-07-08 NOTE — Progress Notes (Signed)
[facility] Counselor/Therapist Progress Note  Patient ID: Madyline Dicke, MRN: [id number],    Date: 07/08/2023  Time Spent: 8:00am-8:57am   Treatment Type: Individual Therapy   pt is seen of virtual video visit via caregility. Pt joins from her parked car at home, reporting privacy, and counselor from her home office. Pt consents to virtual visit and is aware of limitations of such visits.   Reported Symptoms: pt reports hurt by recent interactions w/ sister and brother.    Mental Status Exam: Appearance:  Well Groomed     Behavior: Appropriate  Motor: Normal  Speech/Language:  Clear and Coherent  Affect: Appropriate  Mood: sad  Thought process: normal  Thought content:   WNL  Sensory/Perceptual disturbances:   WNL  Orientation: oriented to person, place, time/date, and situation  Attention: Good  Concentration: Good  Memory: WNL  Fund of knowledge:  Good  Insight:   Good  Judgment:  Good  Impulse Control: Good   Risk Assessment: Danger to Self:  No Self-injurious Behavior: No Danger to Others: No Duty to Warn:no Physical Aggression / Violence:No  Access to Firearms a concern: No  Gang Involvement:No   Subjective: Counselor assessed pt current functioning per pt report.  Processed with pt recent interactions and emotions. reflected positive of boundaries setting w/ mom to not take on things mom needs to resolve.  Explored pattern of interaction w/ brother and sister that has felt hurtful and not respected.  Dicussed ways of asserting feelings for conflict resolution.  Pt affect wnl.  Pt reported she has continued to set boundaries w/ mom for her to communicate w/ siblings to resolve issues between them.  Pt reports that she was hurt by recent interactions w/ sister sharing pt story of past that wasn't favorable and when expressed didn't like was dismissed.  Pt reports that this has been ongoing pattern along w/ statements that imply no intelligent.  Pt is able  to express feeling re: and want to address.  Pt discussed effective communication and ways to can express feelings and wants w/ I messages.    Interventions: Cognitive Behavioral Therapy, Assertiveness/Communication, and Mindfulness Meditation  Diagnosis:Generalized anxiety disorder  MDD (major depressive disorder), recurrent episode, mild (HCC)  Plan: Pt to f/u in 3-4 weeks for counseling.  Pt to f/u as scheduled w/ PCP.   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 Pt idnetifies that she does better w/ exercise and being active.  Enjoys walking/Hiking.  Pt is considering taking up yoga again.  Pt did Tae kwon do in 20s.  Pt is part of a [name] that meets consistent.  Pt reports in past meditation and breathing work was positive and needs to be back into.    Supports:  friends, sister has stood up for her and care/efforts she is providing to parents.  Pt also reports her mom's friend, Jasmine December, is a support.      Goal/Needs for Treatment:  In order of importance to patient 1) decreased anxiety/depression w/ coping skills 2) improve self talk  3) ---    Client Statement of Needs:  Pt: "dealing w/ the anxiety and depression.  Improve the internal self talk so I can handle things/stressors on outside w/out giving up or avoiding."    Treatment Level:outpatient counseling  Symptoms:anxiety, low self worth/negative self tlak  Client Treatment Preferences:f/u w/ counseling biweekly to monthly.        Healthcare consumer's goal for treatment:  Counselor, Forde Radon, [facility] will support the patient's ability to achieve the goals identified. Cognitive Behavioral Therapy, Assertive Communication/Conflict Resolution Training, Relaxation Training, ACT, Humanistic and other evidenced-based practices will be used to promote progress towards healthy functioning.    Healthcare consumer will: Actively participate in therapy, working towards  healthy functioning.     *Justification for Continuation/Discontinuation of Goal: R=Revised, O=Ongoing, A=Achieved, D=Discontinued   Goal 1) Learn and implement coping skills to assist w/ managing stress and reduce anxiety and depressive symptoms per pt report and therapist observation.  Baseline date 03/29/23: Progress towards goal 0; How Often - Daily Target Date Goal Was reviewed Status Code Progress towards goal/Likert rating  03/28/24                            Goal 2) Identify distortions and negative self talk, learn to challenge and reframe AEB Pt report and therapist observation.  Baseline date 03/29/23: Progress towards goal 0; How Often - Daily Target Date Goal Was reviewed Status Code Progress towards goal  03/28/24                                This plan has been reviewed and created by the following participants:  This plan will be reviewed at least every 12 months. Date Behavioral Health Clinician Date Guardian/Patient   03/29/23               [facility] Ophelia Charter [facility] 03/29/23 Verbal Consent Provided                           Forde Radon [facility]

## 2023-08-04 ENCOUNTER — Ambulatory Visit: Payer: 59 | Admitting: Psychology

## 2023-08-04 DIAGNOSIS — F411 Generalized anxiety disorder: Secondary | ICD-10-CM | POA: Diagnosis not present

## 2023-08-04 NOTE — Progress Notes (Signed)
 [facility] Counselor/Therapist Progress Note  Patient ID: Rhonda Wood, MRN: [id number],    Date: 08/04/2023  Time Spent: 8:02am-8:37am   Treatment Type: Individual Therapy   pt is seen of virtual video visit via caregility. Pt joins from her parked car at home, reporting privacy, and counselor from her home office. Pt consents to virtual visit and is aware of limitations of such visits.   Reported Symptoms: pt reports focused self care and coping w/ anxiety  Mental Status Exam: Appearance:  Well Groomed     Behavior: Appropriate  Motor: Normal  Speech/Language:  Clear and Coherent  Affect: Appropriate  Mood: anxious  Thought process: normal  Thought content:   WNL  Sensory/Perceptual disturbances:   WNL  Orientation: oriented to person, place, time/date, and situation  Attention: Good  Concentration: Good  Memory: WNL  Fund of knowledge:  Good  Insight:   Good  Judgment:  Good  Impulse Control: Good   Risk Assessment: Danger to Self:  No Self-injurious Behavior: No Danger to Others: No Duty to Warn:no Physical Aggression / Violence:No  Access to Firearms a concern: No  Gang Involvement:No   Subjective: Counselor assessed pt current functioning per pt report.  Processed with pt recent positives, stressors and emotions. reflected positive w/ intention and focus on self care.  Explored other ways to assist in being consistent w/ meditation and yoga for her self care.  Dicussed progress making w/ financial and career goals.  Pt affect wnl.  Pt reported she has been doing well.  Pt reported that she is feeling stressed w/ the political environment and focused on not doom scrolling and setting limits for self re: news.  Pt reports she is focused on her self care, before taking care of others right now.  Pt discussed want to include more yoga and meditation daily and being routine about this.  Pt identified ways to be more intentional about.  Pt discussed steps  taking to continue paying down debt and has met w/ manager to discuss path for her career at work.  Pt reports she still wants to talk w/ sister about previous interaction and hurt feelings.    Interventions: Cognitive Behavioral Therapy, Assertiveness/Communication, and Mindfulness Meditation  Diagnosis:Generalized anxiety disorder  Plan: Pt to f/u in 3-4 weeks for counseling.  Pt to f/u as scheduled w/ PCP.   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 Pt idnetifies that she does better w/ exercise and being active.  Enjoys walking/Hiking.  Pt is considering taking up yoga again.  Pt did Tae kwon do in 20s.  Pt is part of a [name] that meets consistent.  Pt reports in past meditation and breathing work was positive and needs to be back into.    Supports:  friends, sister has stood up for her and care/efforts she is providing to parents.  Pt also reports her mom's friend, Jasmine December, is a support.      Goal/Needs for Treatment:  In order of importance to patient 1) decreased anxiety/depression w/ coping skills 2) improve self talk  3) ---    Client Statement of Needs:  Pt: "dealing w/ the anxiety and depression.  Improve the internal self talk so I can handle things/stressors on outside w/out giving up or avoiding."    Treatment Level:outpatient counseling  Symptoms:anxiety, low self worth/negative self tlak  Client Treatment Preferences:f/u w/ counseling biweekly to monthly.        Healthcare consumer's goal  for treatment:   Counselor, Forde Radon, [facility] will support the patient's ability to achieve the goals identified. Cognitive Behavioral Therapy, Assertive Communication/Conflict Resolution Training, Relaxation Training, ACT, Humanistic and other evidenced-based practices will be used to promote progress towards healthy functioning.    Healthcare consumer will: Actively participate in therapy, working towards healthy  functioning.     *Justification for Continuation/Discontinuation of Goal: R=Revised, O=Ongoing, A=Achieved, D=Discontinued   Goal 1) Learn and implement coping skills to assist w/ managing stress and reduce anxiety and depressive symptoms per pt report and therapist observation.  Baseline date 03/29/23: Progress towards goal 0; How Often - Daily Target Date Goal Was reviewed Status Code Progress towards goal/Likert rating  03/28/24                            Goal 2) Identify distortions and negative self talk, learn to challenge and reframe AEB Pt report and therapist observation.  Baseline date 03/29/23: Progress towards goal 0; How Often - Daily Target Date Goal Was reviewed Status Code Progress towards goal  03/28/24                                This plan has been reviewed and created by the following participants:  This plan will be reviewed at least every 12 months. Date Behavioral Health Clinician Date Guardian/Patient   03/29/23               [facility] Ophelia Charter [facility] 03/29/23 Verbal Consent Provided                         Forde Radon [facility]

## 2023-09-01 ENCOUNTER — Ambulatory Visit: Payer: 59 | Admitting: Psychology

## 2023-09-01 DIAGNOSIS — F411 Generalized anxiety disorder: Secondary | ICD-10-CM

## 2023-09-01 DIAGNOSIS — F330 Major depressive disorder, recurrent, mild: Secondary | ICD-10-CM | POA: Diagnosis not present

## 2023-09-01 NOTE — Progress Notes (Signed)
 [facility] Counselor/Therapist Progress Note  Patient ID: Rhonda Wood, MRN: [id number],    Date: 09/01/2023  Time Spent: 8:01am-8:51am   Treatment Type: Individual Therapy   pt is seen of virtual video visit via caregility. 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 Pt consents to virtual visit and is aware of limitations of such visits.   Reported Symptoms: pt reports feeling very discouraged   Mental Status Exam: Appearance:  Well Groomed     Behavior: Appropriate  Motor: Normal  Speech/Language:  Clear and Coherent  Affect: Appropriate  Mood: sad  Thought process: normal  Thought content:   WNL  Sensory/Perceptual disturbances:   WNL  Orientation: oriented to person, place, time/date, and situation  Attention: Good  Concentration: Good  Memory: WNL  Fund of knowledge:  Good  Insight:   Good  Judgment:  Good  Impulse Control: Good   Risk Assessment: Danger to Self:  No Self-injurious Behavior: No Danger to Others: No Duty to Warn:no Physical Aggression / Violence:No  Access to Firearms a concern: No  Gang Involvement:No   Subjective: Counselor assessed pt current functioning per pt report.  Processed with pt recent stressors and feeling discouraged.  Assisted in recognizing related thoughts, distortions and assisting in reframing.  Discussed focus on self care, self compassion approach and connecting w/ community.  Pt affect wnl.  Pt reported feeling very discouraged recent.  Pt reports brother recently became engaged.  Pt reports that she feels very discouraged by as reflects what she hasn't accomplished.  Pt able to recognized distortions, recognizing rumination on comparison and able to reframe w/ counselor assistance.   Other stressors include mom's health deteriorating again and recent health issues she has w/ [place] into eyes.  Pt reports self care to focus on and community that she feel support from and  engaging w/ .      Interventions: Cognitive Behavioral Therapy, Assertiveness/Communication, and Mindfulness Meditation  Diagnosis:Generalized anxiety disorder  MDD (major depressive disorder), recurrent episode, mild (HCC)  Plan: Pt to f/u in 3-4 weeks for counseling.  Pt to f/u as scheduled w/ PCP.   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 Pt idnetifies that she does better w/ exercise and being active.  Enjoys walking/Hiking.  Pt is considering taking up yoga again.  Pt did Tae kwon do in 20s.  Pt is part of a [name] that meets consistent.  Pt reports in past meditation and breathing work was positive and needs to be back into.    Supports:  friends, sister has stood up for her and care/efforts she is providing to parents.  Pt also reports her mom's friend, Jasmine December, is a support.      Goal/Needs for Treatment:  In order of importance to patient 1) decreased anxiety/depression w/ coping skills 2) improve self talk  3) ---    Client Statement of Needs:  Pt: "dealing w/ the anxiety and depression.  Improve the internal self talk so I can handle things/stressors on outside w/out giving up or avoiding."    Treatment Level:outpatient counseling  Symptoms:anxiety, low self worth/negative self tlak  Client Treatment Preferences:f/u w/ counseling biweekly to monthly.        Healthcare consumer's goal for treatment:   Counselor, Forde Radon, [facility] will support the patient's ability to achieve the goals identified. Cognitive Behavioral Therapy, Assertive Communication/Conflict Resolution Training, Relaxation Training, ACT, Humanistic and other evidenced-based practices will be  used to promote progress towards healthy functioning.    Healthcare consumer will: Actively participate in therapy, working towards healthy functioning.     *Justification for Continuation/Discontinuation of Goal: R=Revised, O=Ongoing, A=Achieved,  D=Discontinued   Goal 1) Learn and implement coping skills to assist w/ managing stress and reduce anxiety and depressive symptoms per pt report and therapist observation.  Baseline date 03/29/23: Progress towards goal 0; How Often - Daily Target Date Goal Was reviewed Status Code Progress towards goal/Likert rating  03/28/24                            Goal 2) Identify distortions and negative self talk, learn to challenge and reframe AEB Pt report and therapist observation.  Baseline date 03/29/23: Progress towards goal 0; How Often - Daily Target Date Goal Was reviewed Status Code Progress towards goal  03/28/24                                This plan has been reviewed and created by the following participants:  This plan will be reviewed at least every 12 months. Date Behavioral Health Clinician Date Guardian/Patient   03/29/23               [facility] Ophelia Charter [facility] 03/29/23 Verbal Consent Provided                            Forde Radon [facility]

## 2023-10-06 ENCOUNTER — Ambulatory Visit (INDEPENDENT_AMBULATORY_CARE_PROVIDER_SITE_OTHER): Admitting: Psychology

## 2023-10-06 DIAGNOSIS — F330 Major depressive disorder, recurrent, mild: Secondary | ICD-10-CM | POA: Diagnosis not present

## 2023-10-06 DIAGNOSIS — F411 Generalized anxiety disorder: Secondary | ICD-10-CM

## 2023-10-06 NOTE — Progress Notes (Signed)
 [facility] Counselor/Therapist Progress Note  Patient ID: Rhonda Wood, MRN: [id number],    Date: 10/06/2023  Time Spent: 8:00am-8:50am   Treatment Type: Individual Therapy   pt is seen of virtual video visit via caregility. 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 Pt consents to virtual visit and is aware of limitations of such visits.   Reported Symptoms: pt reports some anxiety and worries w/ stressors.  Pt reports focused on her self care for coping.  Mental Status Exam: Appearance:  Well Groomed     Behavior: Appropriate  Motor: Normal  Speech/Language:  Clear and Coherent  Affect: Appropriate  Mood: anxious  Thought process: normal  Thought content:   WNL  Sensory/Perceptual disturbances:   WNL  Orientation: oriented to person, place, time/date, and situation  Attention: Good  Concentration: Good  Memory: WNL  Fund of knowledge:  Good  Insight:   Good  Judgment:  Good  Impulse Control: Good   Risk Assessment: Danger to Self:  No Self-injurious Behavior: No Danger to Others: No Duty to Warn:no Physical Aggression / Violence:No  Access to Firearms a concern: No  Gang Involvement:No   Subjective: Counselor assessed pt current functioning per pt report.  Processed with pt stressors and related emotions.  Explored pt use of coping skills and reflected positive coping plan.  Assisted w/ identify ways to assert w/ siblings.    Pt affect wnl.  Pt reported overall mood ok- some days of increased anxiety or feeling down. Pt reports she is stressed w/ finances, stressed w/ political environment, stressed w/ parents health.  Pt discussed the things she is doing that she feels good about.  Pt discussed financial planning.  Pt does report struggle to assert w/ siblings and able to identify in assertive way things she wants to express/advocate for self.     Interventions: Cognitive Behavioral Therapy,  Assertiveness/Communication, and Mindfulness Meditation  Diagnosis:Generalized anxiety disorder  MDD (major depressive disorder), recurrent episode, mild (HCC)  Plan: Pt to f/u in 3-4 weeks for counseling.  Pt to f/u as scheduled w/ PCP.   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 Pt idnetifies that she does better w/ exercise and being active.  Enjoys walking/Hiking.  Pt is considering taking up yoga again.  Pt did Tae kwon do in 20s.  Pt is part of a [name] that meets consistent.  Pt reports in past meditation and breathing work was positive and needs to be back into.    Supports:  friends, sister has stood up for her and care/efforts she is providing to parents.  Pt also reports her mom's friend, Rhonda Wood, is a support.      Goal/Needs for Treatment:  In order of importance to patient 1) decreased anxiety/depression w/ coping skills 2) improve self talk  3) ---    Client Statement of Needs:  Pt: "dealing w/ the anxiety and depression.  Improve the internal self talk so I can handle things/stressors on outside w/out giving up or avoiding."    Treatment Level:outpatient counseling  Symptoms:anxiety, low self worth/negative self tlak  Client Treatment Preferences:f/u w/ counseling biweekly to monthly.        Healthcare consumer's goal for treatment:   Counselor, Rhonda Wood, [facility] will support the patient's ability to achieve the goals identified. Cognitive Behavioral Therapy, Assertive Communication/Conflict Resolution Training, Relaxation Training, ACT, Humanistic and other evidenced-based practices will be used to promote progress towards  healthy functioning.    Healthcare consumer will: Actively participate in therapy, working towards healthy functioning.     *Justification for Continuation/Discontinuation of Goal: R=Revised, O=Ongoing, A=Achieved, D=Discontinued   Goal 1) Learn and implement coping skills to assist w/  managing stress and reduce anxiety and depressive symptoms per pt report and therapist observation.  Baseline date 03/29/23: Progress towards goal 0; How Often - Daily Target Date Goal Was reviewed Status Code Progress towards goal/Likert rating  03/28/24                            Goal 2) Identify distortions and negative self talk, learn to challenge and reframe AEB Pt report and therapist observation.  Baseline date 03/29/23: Progress towards goal 0; How Often - Daily Target Date Goal Was reviewed Status Code Progress towards goal  03/28/24                                This plan has been reviewed and created by the following participants:  This plan will be reviewed at least every 12 months. Date Behavioral Health Clinician Date Guardian/Patient   03/29/23               [facility] Rhonda Wood Rhonda Wood 03/29/23 Verbal Consent Provided                                  Rhonda Wood Rhonda Wood

## 2023-11-03 ENCOUNTER — Ambulatory Visit (INDEPENDENT_AMBULATORY_CARE_PROVIDER_SITE_OTHER): Admitting: Psychology

## 2023-11-03 DIAGNOSIS — F330 Major depressive disorder, recurrent, mild: Secondary | ICD-10-CM

## 2023-11-03 DIAGNOSIS — F411 Generalized anxiety disorder: Secondary | ICD-10-CM | POA: Diagnosis not present

## 2023-11-03 NOTE — Progress Notes (Signed)
 [facility] Counselor/Therapist Progress Note  Patient ID: Rhonda Wood, MRN: [id number],    Date: 11/03/2023  Time Spent: 8:16am-8:48am   Treatment Type: Individual Therapy   pt is seen of virtual video visit via caregility. 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 Pt consents to virtual visit and is aware of limitations of such visits.   Reported Symptoms: pt reports mood has been good.  Pt reports she is focused on financial goals and making progress towards.    Mental Status Exam: Appearance:  Well Groomed     Behavior: Appropriate  Motor: Normal  Speech/Language:  Clear and Coherent  Affect: Appropriate  Mood: anxious  Thought process: normal  Thought content:   WNL  Sensory/Perceptual disturbances:   WNL  Orientation: oriented to person, place, time/date, and situation  Attention: Good  Concentration: Good  Memory: WNL  Fund of knowledge:  Good  Insight:   Good  Judgment:  Good  Impulse Control: Good   Risk Assessment: Danger to Self:  No Self-injurious Behavior: No Danger to Others: No Duty to Warn:no Physical Aggression / Violence:No  Access to Firearms a concern: No  Gang Involvement:No   Subjective: Counselor assessed pt current functioning per pt report.  Processed with positives, stressors and emotions.  Explored positive progress w/ is making and not being "stuck" anymore.  Discussed next steps w/ her financial goals and impact on wellbeing.    Pt affect wnl.  Pt reported her mood has been good.  Pt looking forward to birthday and couple days off.  Pt reports she is making progress towards her financial goals.  Pt plans to consolidate credit card debt into one to have one payment.  Pt reports she is clearing out her storage unit and will be a big monthly savings.  Pt discusssed her intentions to go on a vacation as hasn't been able to in years.  Pt recognizing that the financial stress impacts her  mental health.    Interventions: Cognitive Behavioral Therapy, Assertiveness/Communication, and Mindfulness Meditation  Diagnosis:Generalized anxiety disorder  MDD (major depressive disorder), recurrent episode, mild (HCC)  Plan: Pt to f/u in 4 weeks for counseling.  Pt to f/u as scheduled w/ PCP.   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 Pt idnetifies that she does better w/ exercise and being active.  Enjoys walking/Hiking.  Pt is considering taking up yoga again.  Pt did Tae kwon do in 20s.  Pt is part of a [name] that meets consistent.  Pt reports in past meditation and breathing work was positive and needs to be back into.    Supports:  friends, sister has stood up for her and care/efforts she is providing to parents.  Pt also reports her mom's friend, [name], is a support.      Goal/Needs for Treatment:  In order of importance to patient 1) decreased anxiety/depression w/ coping skills 2) improve self talk  3) ---    Client Statement of Needs:  Pt: "dealing w/ the anxiety and depression.  Improve the internal self talk so I can handle things/stressors on outside w/out giving up or avoiding."    Treatment Level:outpatient counseling  Symptoms:anxiety, low self worth/negative self tlak  Client Treatment Preferences:f/u w/ counseling biweekly to monthly.        Healthcare consumer's goal for treatment:   Counselor, Clydie Darter, [facility] will support the patient's ability to achieve the goals identified.  Cognitive Behavioral Therapy, Assertive Communication/Conflict Resolution Training, Relaxation Training, ACT, Humanistic and other evidenced-based practices will be used to promote progress towards healthy functioning.    Healthcare consumer will: Actively participate in therapy, working towards healthy functioning.     *Justification for Continuation/Discontinuation of Goal: R=Revised, O=Ongoing, A=Achieved,  D=Discontinued   Goal 1) Learn and implement coping skills to assist w/ managing stress and reduce anxiety and depressive symptoms per pt report and therapist observation.  Baseline date 03/29/23: Progress towards goal 0; How Often - Daily Target Date Goal Was reviewed Status Code Progress towards goal/Likert rating  03/28/24                            Goal 2) Identify distortions and negative self talk, learn to challenge and reframe AEB Pt report and therapist observation.  Baseline date 03/29/23: Progress towards goal 0; How Often - Daily Target Date Goal Was reviewed Status Code Progress towards goal  03/28/24                                This plan has been reviewed and created by the following participants:  This plan will be reviewed at least every 12 months. Date Behavioral Health Clinician Date Guardian/Patient   03/29/23               [facility] [name] [facility] 03/29/23 Verbal Consent Provided                         Clydie Darter [facility]

## 2023-11-10 ENCOUNTER — Encounter: Payer: Managed Care, Other (non HMO) | Admitting: Physician Assistant

## 2023-12-08 ENCOUNTER — Ambulatory Visit (INDEPENDENT_AMBULATORY_CARE_PROVIDER_SITE_OTHER): Admitting: Psychology

## 2023-12-08 DIAGNOSIS — F411 Generalized anxiety disorder: Secondary | ICD-10-CM

## 2023-12-08 DIAGNOSIS — F330 Major depressive disorder, recurrent, mild: Secondary | ICD-10-CM | POA: Diagnosis not present

## 2023-12-08 NOTE — Progress Notes (Signed)
 [facility] Counselor/Therapist Progress Note  Patient ID: Rhonda Wood, MRN: [id number],    Date: 12/08/2023  Time Spent: 7:59am-8:15 and 8:19-8:35am    Treatment Type: Individual Therapy   pt is seen of virtual video visit via caregility. 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 Pt consents to virtual visit and is aware of limitations of such visits.  Loss of power created disruption in connection, pt and counselor had to reconnect.  Reported Symptoms: pt reports mood overall good.  Some sadness.  continued focused on financial goals and making progress towards.    Mental Status Exam: Appearance:  Well Groomed     Behavior: Appropriate  Motor: Normal  Speech/Language:  Clear and Coherent  Affect: Appropriate  Mood: sad  Thought process: normal  Thought content:   WNL  Sensory/Perceptual disturbances:   WNL  Orientation: oriented to person, place, time/date, and situation  Attention: Good  Concentration: Good  Memory: WNL  Fund of knowledge:  Good  Insight:   Good  Judgment:  Good  Impulse Control: Good   Risk Assessment: Danger to Self:  No Self-injurious Behavior: No Danger to Others: No Duty to Warn:no Physical Aggression / Violence:No  Access to Firearms a concern: No  Gang Involvement:No   Subjective: Counselor assessed pt current functioning per pt report.  Processed with positives, stressors and related emotions.  Explored continued progress w/ pt actions for financial independence.  Explored some feelings of sadness present w/ recent loss.  Reflected pt living and engaging consistent w/ values.  Pt affect wnl.  Pt reported her mood overall has been good.  Pt reports she is charting mood and using as tool for awareness and coping.  Pt discussed some positives looking forward to w/ vacation and work travel in fall.  Pt reports she is making progress towards her financial goals.  Pt reports some sadness as  recent death by suicide of youtuber she followed on social media.  Pt discussed how brought up 2 previous losses by suicide.  Pt discussed online bullying present for this person and how she makes decision to not create online presence herself.     Interventions: Cognitive Behavioral Therapy, Assertiveness/Communication, and Mindfulness Meditation  Diagnosis:Generalized anxiety disorder  MDD (major depressive disorder), recurrent episode, mild (HCC)  Plan: Pt to f/u in 4 weeks for counseling.  Pt to f/u as scheduled w/ PCP.   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 Pt idnetifies that she does better w/ exercise and being active.  Enjoys walking/Hiking.  Pt is considering taking up yoga again.  Pt did Tae kwon do in 20s.  Pt is part of a [name] that meets consistent.  Pt reports in past meditation and breathing work was positive and needs to be back into.    Supports:  friends, sister has stood up for her and care/efforts she is providing to parents.  Pt also reports her mom's friend, Rhonda Wood, is a support.      Goal/Needs for Treatment:  In order of importance to patient 1) decreased anxiety/depression w/ coping skills 2) improve self talk  3) ---    Client Statement of Needs:  Pt: dealing w/ the anxiety and depression.  Improve the internal self talk so I can handle things/stressors on outside w/out giving up or avoiding.    Treatment Level:outpatient counseling  Symptoms:anxiety, low self worth/negative self tlak  Client Treatment Preferences:f/u w/ counseling biweekly  to monthly.        Healthcare consumer's goal for treatment:   Counselor, Rhonda Wood, [facility] will support the patient's ability to achieve the goals identified. Cognitive Behavioral Therapy, Assertive Communication/Conflict Resolution Training, Relaxation Training, ACT, Humanistic and other evidenced-based practices will be used to promote progress  towards healthy functioning.    Healthcare consumer will: Actively participate in therapy, working towards healthy functioning.     *Justification for Continuation/Discontinuation of Goal: R=Revised, O=Ongoing, A=Achieved, D=Discontinued   Goal 1) Learn and implement coping skills to assist w/ managing stress and reduce anxiety and depressive symptoms per pt report and therapist observation.  Baseline date 03/29/23: Progress towards goal 0; How Often - Daily Target Date Goal Was reviewed Status Code Progress towards goal/Likert rating  03/28/24                            Goal 2) Identify distortions and negative self talk, learn to challenge and reframe AEB Pt report and therapist observation.  Baseline date 03/29/23: Progress towards goal 0; How Often - Daily Target Date Goal Was reviewed Status Code Progress towards goal  03/28/24                                This plan has been reviewed and created by the following participants:  This plan will be reviewed at least every 12 months. Date Behavioral Health Clinician Date Guardian/Patient   03/29/23               Rhonda Wood 03/29/23 Verbal Consent Provided                         Rhonda Wood

## 2024-01-10 ENCOUNTER — Ambulatory Visit (INDEPENDENT_AMBULATORY_CARE_PROVIDER_SITE_OTHER): Admitting: Psychology

## 2024-01-10 DIAGNOSIS — F330 Major depressive disorder, recurrent, mild: Secondary | ICD-10-CM

## 2024-01-10 DIAGNOSIS — F411 Generalized anxiety disorder: Secondary | ICD-10-CM | POA: Diagnosis not present

## 2024-01-10 NOTE — Progress Notes (Signed)
 [facility] Counselor/Therapist Progress Note  Patient ID: Rhonda Wood, MRN: [id number],    Date: 01/10/2024  Time Spent: 8:03am-8:58am    Treatment Type: Individual Therapy   pt is seen of virtual video visit via caregility. 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 Pt consents to virtual visit and is aware of limitations of such visits.   Reported Symptoms: pt reports increased stressors and related increased worry.      Mental Status Exam: Appearance:  Well Groomed     Behavior: Appropriate  Motor: Normal  Speech/Language:  Clear and Coherent  Affect: Appropriate  Mood: anxious  Thought process: normal  Thought content:   WNL  Sensory/Perceptual disturbances:   WNL  Orientation: oriented to person, place, time/date, and situation  Attention: Good  Concentration: Good  Memory: WNL  Fund of knowledge:  Good  Insight:   Good  Judgment:  Good  Impulse Control: Good   Risk Assessment: Danger to Self:  No Self-injurious Behavior: No Danger to Others: No Duty to Warn:no Physical Aggression / Violence:No  Access to Firearms a concern: No  Gang Involvement:No   Subjective: Counselor assessed pt current functioning per pt report.  Processed with stressors and increased anxiety.  Assisted pt w/ identifying what is in her control and making decisions based on what she knows now.  Discussed her values and being consistent about and knowing her limitations and boundaries.   Pt affect wnl.  Pt report she has been very stressed this past week w/ learning her cat is in heart failure and the financial strain places for her.  Pt discussed how she has been considering her options and ways to proceed.  Pt also reports changes at work and worries potential cuts//layoffs.  Pt was able to identify what is in her control and decision she has made to tx cat w/ medication and financially what she can't go beyond at this point.  Pt still  acknowledges progress she has made financially still.  Pt reports on asserting self w/ her sister about decision re: travel and felt good to be heard although sister originally disagreed w/ her decision.  Pt reports she is journaling about emotions and thoughts and helping to reframe.   Interventions: Cognitive Behavioral Therapy, Assertiveness/Communication, and Mindfulness Meditation  Diagnosis:Generalized anxiety disorder  MDD (major depressive disorder), recurrent episode, mild (HCC)  Plan: Pt to f/u in 4 weeks for counseling.  Pt to f/u as scheduled w/ PCP.   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 Pt idnetifies that she does better w/ exercise and being active.  Enjoys walking/Hiking.  Pt is considering taking up yoga again.  Pt did Tae kwon do in 20s.  Pt is part of a [name] that meets consistent.  Pt reports in past meditation and breathing work was positive and needs to be back into.    Supports:  friends, sister has stood up for her and care/efforts she is providing to parents.  Pt also reports her mom's friend, Reena, is a support.      Goal/Needs for Treatment:  In order of importance to patient 1) decreased anxiety/depression w/ coping skills 2) improve self talk  3) ---    Client Statement of Needs:  Pt: dealing w/ the anxiety and depression.  Improve the internal self talk so I can handle things/stressors on outside w/out giving up or avoiding.    Treatment Level:outpatient counseling  Symptoms:anxiety, low self worth/negative self tlak  Client Treatment Preferences:f/u w/ counseling biweekly to monthly.        Healthcare consumer's goal for treatment:   Counselor, [name], [facility] will support the patient's ability to achieve the goals identified. Cognitive Behavioral Therapy, Assertive Communication/Conflict Resolution Training, Relaxation Training, ACT, Humanistic and other evidenced-based practices  will be used to promote progress towards healthy functioning.    Healthcare consumer will: Actively participate in therapy, working towards healthy functioning.     *Justification for Continuation/Discontinuation of Goal: R=Revised, O=Ongoing, A=Achieved, D=Discontinued   Goal 1) Learn and implement coping skills to assist w/ managing stress and reduce anxiety and depressive symptoms per pt report and therapist observation.  Baseline date 03/29/23: Progress towards goal 0; How Often - Daily Target Date Goal Was reviewed Status Code Progress towards goal/Likert rating  03/28/24                            Goal 2) Identify distortions and negative self talk, learn to challenge and reframe AEB Pt report and therapist observation.  Baseline date 03/29/23: Progress towards goal 0; How Often - Daily Target Date Goal Was reviewed Status Code Progress towards goal  03/28/24                                This plan has been reviewed and created by the following participants:  This plan will be reviewed at least every 12 months. Date Behavioral Health Clinician Date Guardian/Patient   03/29/23               [facility] Herald [facility] 03/29/23 Verbal Consent Provided                       [name] [facility] [place]

## 2024-01-21 ENCOUNTER — Encounter: Payer: Self-pay | Admitting: Physician Assistant

## 2024-01-21 ENCOUNTER — Ambulatory Visit: Admitting: Physician Assistant

## 2024-01-21 VITALS — BP 114/77 | HR 80 | Temp 98.1°F | Ht 66.0 in | Wt 180.2 lb

## 2024-01-21 DIAGNOSIS — N926 Irregular menstruation, unspecified: Secondary | ICD-10-CM | POA: Diagnosis not present

## 2024-01-21 DIAGNOSIS — H04123 Dry eye syndrome of bilateral lacrimal glands: Secondary | ICD-10-CM

## 2024-01-21 DIAGNOSIS — E559 Vitamin D deficiency, unspecified: Secondary | ICD-10-CM

## 2024-01-21 DIAGNOSIS — Z0001 Encounter for general adult medical examination with abnormal findings: Secondary | ICD-10-CM | POA: Diagnosis not present

## 2024-01-21 DIAGNOSIS — Z0000 Encounter for general adult medical examination without abnormal findings: Secondary | ICD-10-CM

## 2024-01-21 DIAGNOSIS — E538 Deficiency of other specified B group vitamins: Secondary | ICD-10-CM

## 2024-01-21 NOTE — Progress Notes (Signed)
 Patient ID: Rhonda Wood, female    DOB: 03-21-1971, 53 y.o.   MRN: [id number]   Assessment & Plan:  Encounter for annual physical exam -     Lipid panel; Future -     Comprehensive metabolic panel with GFR; Future -     CBC with Differential/Platelet; Future -     Hemoglobin A1c; Future -     TSH; Future -     FSH/LH; Future  Irregular menses -     FSH/LH; Future  B12 deficiency -     Vitamin B12; Future  Vitamin D deficiency -     VITAMIN D 25 Hydroxy (Vit-D Deficiency, Fractures); Future  Chronically dry eyes, bilateral    Assessment & Plan  Annual CPE Age-appropriate screening and counseling performed today. Will check labs and call with results. Preventive measures discussed and printed in AVS for patient.   Patient Counseling: [x]   Nutrition: Stressed importance of moderation in sodium/caffeine intake, saturated fat and cholesterol, caloric balance, sufficient intake of fresh fruits, vegetables, and fiber.  [x]   Stressed the importance of regular exercise.   [x]   Substance Abuse: Discussed cessation/primary prevention of tobacco, alcohol, or other drug use; driving or other dangerous activities under the influence; availability of treatment for abuse.   []   Injury prevention: Discussed safety belts, safety helmets, smoke detector, smoking near bedding or upholstery.   []   Sexuality: Discussed sexually transmitted diseases, partner selection, use of condoms, avoidance of unintended pregnancy  and contraceptive alternatives.   [x]   Dental health: Discussed importance of regular tooth brushing, flossing, and dental visits.  [x]   Health maintenance and immunizations reviewed. Please refer to Health maintenance section.      Perimenopausal symptoms Perimenopausal symptoms with irregular menstrual cycles and possible sporadic hot flashes. Discussed the potential for hormonal changes and the use of FSH and LH levels to assess menopausal status. 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 Previous treatments included doxycycline , which was not tolerated. Discussed the potential benefits of IPL treatment despite its cost and lack of insurance coverage. - Encourage continuation of eye drops and eyelid scrubs - Consider IPL treatment for dry eye syndrome - Continue to work with eye Dr on this   Vitamin D deficiency Vitamin D deficiency. She has not been taking vitamin D supplements recently. - Encourage resumption of vitamin D supplementation - Include vitamin D level in upcoming labs  Vitamin B12 deficiency Vitamin B12 deficiency. She has not been taking B12 supplements recently. - Encourage resumption of vitamin B12 supplementation - Include vitamin B12 level in upcoming labs      Return in about 1 year (around 01/20/2025) for physical, fasting labs .    Subjective:    Chief Complaint  Patient presents with   Annual Exam    Pt here for Annual Exam and is not currently fasting. Mammo is scheduled for Nov.    HPI Discussed the use of AI scribe software for clinical note transcription with the patient, who gave verbal consent to proceed.  History of Present Illness Rhonda Wood is a 53 year old female who presents for annual CPE, weight management, and dry eye concerns.  She is focused on weight management, maintaining an exercise routine by walking, though consistency is challenging. She has eliminated sodas but struggles to cut out sweet tea, believing sugar reduction  is crucial for weight control. Her weight fluctuates, especially post-meals, leading to discouragement when the scale increases.  She experiences irregular menstrual cycles, with only one period this year and last year. She has sporadic, short-lived hot flashes, sometimes nocturnal.  She has a history of irritable bowel  syndrome, exacerbated by cold water, but no lumps or swollen areas in the abdomen. Her symptoms are manageable, though cold water worsens her condition.  She has dry eye syndrome associated with rosacea. She has tried various treatments, including IPL sessions, which were beneficial. She cannot tolerate doxycycline  due to adverse effects and is not currently using specific medications for dry eyes but maintains a regimen of eye drops and scrubs.  She has a history of COVID-19, which resulted in a persistent cough that has since resolved. No recent hospitalizations or surgeries.  She has not been taking B12 or vitamin D  supplements recently but acknowledges the need to resume them. She has not had a mammogram this year but has scheduled one for November. She plans to get a measles shot and is considering other vaccinations as recommended.     Past Medical History:  Diagnosis Date   Anxiety    COVID-19 2024   occasional cough lingering   Depression    GERD (gastroesophageal reflux disease)    IBS (irritable bowel syndrome) 2018    Past Surgical History:  Procedure Laterality Date   BACK SURGERY  11/11/2020   Lower spine - minor per patient   SPINE SURGERY  10/2020    Family History  Problem Relation Age of Onset   Colon polyps Mother    Hypertension Mother    Anxiety disorder Mother    Asthma Mother    Depression Mother    Obesity Mother    Vision loss Mother    Colon polyps Father    Hypertension Father    Learning disabilities Father    Varicose Veins Father    Dementia Maternal Grandmother    Alcohol abuse Maternal Grandmother    Stroke Maternal Grandmother    Dementia Paternal Grandmother    Colon cancer Paternal Grandfather    Cancer Paternal Grandfather    Heart disease Paternal Grandfather    Alcohol abuse Maternal Grandfather    Diabetes Maternal Grandfather    Drug abuse Maternal Grandfather    Obesity Maternal Grandfather    Alcohol abuse Paternal Uncle     Depression Maternal Aunt    Diabetes Maternal Aunt    Obesity Maternal Aunt    Diabetes Maternal Aunt    Obesity Maternal Aunt    Diabetes Maternal Uncle    Obesity Maternal Uncle    Diabetes Maternal Uncle    Obesity Maternal Uncle    Diabetes Paternal Aunt    Miscarriages / [place] Brother    Miscarriages / [place] Sister    Miscarriages / Stillbirths Paternal Uncle    Esophageal cancer Neg Hx    Rectal cancer Neg Hx    Stomach cancer Neg Hx     Social History   Tobacco Use   Smoking status: Never   Smokeless tobacco: Never  Substance Use Topics   Alcohol use: Never   Drug use: Never     Allergies  Allergen Reactions   Egg-Derived Products    Flavoring Agent (Non-Screening)    Food    Lactose    Peanut-Containing Drug Products    Tomato    Soy Allergy (Obsolete) Other (See Comments)    lethargic    Review  of Systems NEGATIVE UNLESS OTHERWISE INDICATED IN HPI      Objective:     BP 114/77   Pulse 80   Temp 98.1 F (36.7 C)   Ht 5' 6 (1.676 m)   Wt 180 lb 3.2 oz (81.7 kg)   SpO2 96%   BMI 29.09 kg/m   Wt Readings from Last 3 Encounters:  01/21/24 180 lb 3.2 oz (81.7 kg)  03/10/23 178 lb 3.2 oz (80.8 kg)  12/25/22 175 lb (79.4 kg)    BP Readings from Last 3 Encounters:  01/21/24 114/77  03/10/23 110/68  12/25/22 113/73     Physical Exam Vitals and nursing note reviewed.  Constitutional:      Appearance: Normal appearance. She is normal weight. She is not toxic-appearing.  HENT:     Head: Normocephalic and atraumatic.     Right Ear: Tympanic membrane, ear canal and external ear normal.     Left Ear: Tympanic membrane, ear canal and external ear normal.     Nose: Nose normal.     Mouth/Throat:     Mouth: Mucous membranes are moist.  Eyes:     Extraocular Movements: Extraocular movements intact.     Conjunctiva/sclera: Conjunctivae normal.     Pupils: Pupils are equal, round, and reactive to light.  Cardiovascular:     Rate  and Rhythm: Normal rate and regular rhythm.     Pulses: Normal pulses.     Heart sounds: Normal heart sounds.  Pulmonary:     Effort: Pulmonary effort is normal.     Breath sounds: Normal breath sounds.  Abdominal:     General: Abdomen is flat. Bowel sounds are normal.     Palpations: Abdomen is soft.  Musculoskeletal:        General: Normal range of motion.     Cervical back: Normal range of motion and neck supple.     Right lower leg: No edema.     Left lower leg: No edema.  Skin:    General: Skin is warm and dry.     Findings: No lesion or rash.  Neurological:     General: No focal deficit present.     Mental Status: She is alert and oriented to person, place, and time.  Psychiatric:        Mood and Affect: Mood normal.        Behavior: Behavior normal.        Thought Content: Thought content normal.        Judgment: Judgment normal.             Seena Face M Annastasia Haskins, PA-C

## 2024-01-25 ENCOUNTER — Other Ambulatory Visit (INDEPENDENT_AMBULATORY_CARE_PROVIDER_SITE_OTHER)

## 2024-01-25 ENCOUNTER — Telehealth: Payer: Self-pay | Admitting: Physician Assistant

## 2024-01-25 DIAGNOSIS — E538 Deficiency of other specified B group vitamins: Secondary | ICD-10-CM

## 2024-01-25 DIAGNOSIS — E559 Vitamin D deficiency, unspecified: Secondary | ICD-10-CM

## 2024-01-25 DIAGNOSIS — Z0000 Encounter for general adult medical examination without abnormal findings: Secondary | ICD-10-CM

## 2024-01-25 DIAGNOSIS — N926 Irregular menstruation, unspecified: Secondary | ICD-10-CM

## 2024-01-25 LAB — CBC WITH DIFFERENTIAL/PLATELET
Basophils Absolute: 0.1 K/uL (ref 0.0–0.1)
Basophils Relative: 0.9 % (ref 0.0–3.0)
Eosinophils Absolute: 0 K/uL (ref 0.0–0.7)
Eosinophils Relative: 0.7 % (ref 0.0–5.0)
HCT: 40.3 % (ref 36.0–46.0)
Hemoglobin: 13.3 g/dL (ref 12.0–15.0)
Lymphocytes Relative: 35.8 % (ref 12.0–46.0)
Lymphs Abs: 2 K/uL (ref 0.7–4.0)
MCHC: 33.1 g/dL (ref 30.0–36.0)
MCV: 91.6 fl (ref 78.0–100.0)
Monocytes Absolute: 0.4 K/uL (ref 0.1–1.0)
Monocytes Relative: 6.7 % (ref 3.0–12.0)
Neutro Abs: 3.2 K/uL (ref 1.4–7.7)
Neutrophils Relative %: 55.9 % (ref 43.0–77.0)
Platelets: 213 K/uL (ref 150.0–400.0)
RBC: 4.39 Mil/uL (ref 3.87–5.11)
RDW: 14.1 % (ref 11.5–15.5)
WBC: 5.6 K/uL (ref 4.0–10.5)

## 2024-01-25 LAB — COMPREHENSIVE METABOLIC PANEL WITH GFR
ALT: 12 U/L (ref 0–35)
AST: 13 U/L (ref 0–37)
Albumin: 4.4 g/dL (ref 3.5–5.2)
Alkaline Phosphatase: 96 U/L (ref 39–117)
BUN: 14 mg/dL (ref 6–23)
CO2: 27 meq/L (ref 19–32)
Calcium: 9.4 mg/dL (ref 8.4–10.5)
Chloride: 104 meq/L (ref 96–112)
Creatinine, Ser: 0.85 mg/dL (ref 0.40–1.20)
GFR: 78.88 mL/min (ref >60.00)
Glucose, Bld: 78 mg/dL (ref 70–99)
Potassium: 4.3 meq/L (ref 3.5–5.1)
Sodium: 140 meq/L (ref 135–145)
Total Bilirubin: 0.4 mg/dL (ref 0.2–1.2)
Total Protein: 6.9 g/dL (ref 6.0–8.3)

## 2024-01-25 LAB — LIPID PANEL
Cholesterol: 190 mg/dL (ref 0–200)
HDL: 60.3 mg/dL (ref >39.00)
LDL Cholesterol: 102 mg/dL — ABNORMAL HIGH (ref 0–99)
NonHDL: 129.63
Total CHOL/HDL Ratio: 3
Triglycerides: 138 mg/dL (ref 0.0–149.0)
VLDL: 27.6 mg/dL (ref 0.0–40.0)

## 2024-01-25 LAB — VITAMIN D 25 HYDROXY (VIT D DEFICIENCY, FRACTURES): VITD: 25.24 ng/mL — ABNORMAL LOW (ref 30.00–100.00)

## 2024-01-25 LAB — HEMOGLOBIN A1C: Hgb A1c MFr Bld: 5.8 % (ref 4.6–6.5)

## 2024-01-25 LAB — FSH/LH
FSH: 41.1 m[IU]/mL
LH: 28.7 m[IU]/mL

## 2024-01-25 LAB — TSH: TSH: 1.64 u[IU]/mL (ref 0.35–5.50)

## 2024-01-25 LAB — VITAMIN B12: Vitamin B-12: 164 pg/mL — ABNORMAL LOW (ref 211–911)

## 2024-01-25 NOTE — Telephone Encounter (Signed)
 Form in PCP inbasket waiting on results from labs to finish completing

## 2024-01-25 NOTE — Telephone Encounter (Signed)
 Patient dropped off document HEALTH FORM , to be filled out by provider. Patient requested to send it back via Call Patient to pick up within 5-days. Document is located in providers tray at front office.Please advise at Mobile [phone] (mobile)

## 2024-01-28 ENCOUNTER — Ambulatory Visit: Payer: Self-pay | Admitting: Physician Assistant

## 2024-02-02 NOTE — Telephone Encounter (Signed)
 Form completed and patient coming by office to pick up.

## 2024-02-08 ENCOUNTER — Ambulatory Visit (INDEPENDENT_AMBULATORY_CARE_PROVIDER_SITE_OTHER): Admitting: Psychology

## 2024-02-08 DIAGNOSIS — F411 Generalized anxiety disorder: Secondary | ICD-10-CM | POA: Diagnosis not present

## 2024-02-08 DIAGNOSIS — F330 Major depressive disorder, recurrent, mild: Secondary | ICD-10-CM

## 2024-02-08 NOTE — Progress Notes (Signed)
 [facility] Counselor/Therapist Progress Note  Patient ID: Rhonda Wood, MRN: [id number],    Date: 02/08/2024  Time Spent: 8:15am-9:01am    Treatment Type: Individual Therapy   pt is seen of virtual video visit via caregility. 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 Pt consents to virtual visit and is aware of limitations of such visits.   Reported Symptoms: pt reports some anxiety about future.  Pt reports good visit/vacation to brother's.      Mental Status Exam: Appearance:  Well Groomed     Behavior: Appropriate  Motor: Normal  Speech/Language:  Clear and Coherent  Affect: Appropriate  Mood: anxious  Thought process: normal  Thought content:   WNL  Sensory/Perceptual disturbances:   WNL  Orientation: oriented to person, place, time/date, and situation  Attention: Good  Concentration: Good  Memory: WNL  Fund of knowledge:  Good  Insight:   Good  Judgment:  Good  Impulse Control: Good   Risk Assessment: Danger to Self:  No Self-injurious Behavior: No Danger to Others: No Duty to Warn:no Physical Aggression / Violence:No  Access to Firearms a concern: No  Gang Involvement:No   Subjective: Counselor assessed pt current functioning per pt report.  Processed with positive,, stressors and interactions w/ siblings.  Reflected positive of recognizing and asserting need for own space.  Discussed stressors- financial and supporting parents. Assisted w/ identifying what's in her control and what's not.  Pt affect wnl.  Pt reports she just got back from visit w/ sister to see brother.  Pt reports overall good trip. Pt is glad she chose her own space to stay in.  Pt discussed some stressors w/ some interactions and pricey choices.  Pt tried to convince brother to talk to mom and able to recognize that he will have to handle his own. Pt discussed family dynamics and her role in family and recognized what boundaries need.     Interventions: Cognitive Behavioral Therapy, Assertiveness/Communication, and Mindfulness Meditation  Diagnosis:Generalized anxiety disorder  MDD (major depressive disorder), recurrent episode, mild (HCC)  Plan: Pt to f/u in 4 weeks for counseling.  Pt to f/u as scheduled w/ PCP.   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 Pt idnetifies that she does better w/ exercise and being active.  Enjoys walking/Hiking.  Pt is considering taking up yoga again.  Pt did Tae kwon do in 20s.  Pt is part of a [name] that meets consistent.  Pt reports in past meditation and breathing work was positive and needs to be back into.    Supports:  friends, sister has stood up for her and care/efforts she is providing to parents.  Pt also reports her mom's friend, Rhonda Wood, is a support.      Goal/Needs for Treatment:  In order of importance to patient 1) decreased anxiety/depression w/ coping skills 2) improve self talk  3) ---    Client Statement of Needs:  Pt: dealing w/ Rhonda anxiety and depression.  Improve Rhonda internal self talk so I can handle things/stressors on outside w/out giving up or avoiding.    Treatment Level:outpatient counseling  Symptoms:anxiety, low self worth/negative self tlak  Client Treatment Preferences:f/u w/ counseling biweekly to monthly.        Healthcare consumer's goal for treatment:   Counselor, Rhonda Wood, [facility] will support Rhonda patient's ability to achieve Rhonda goals identified. Cognitive Behavioral Therapy, Assertive Communication/Conflict Resolution Training, Management consultant,  ACT, Humanistic and other evidenced-based practices will be used to promote progress towards healthy functioning.    Healthcare consumer will: Actively participate in therapy, working towards healthy functioning.     *Justification for Continuation/Discontinuation of Goal: R=Revised, O=Ongoing, A=Achieved, D=Discontinued   Goal 1)  Learn and implement coping skills to assist w/ managing stress and reduce anxiety and depressive symptoms per pt report and therapist observation.  Baseline date 03/29/23: Progress towards goal 0; How Often - Daily Target Date Goal Was reviewed Status Code Progress towards goal/Likert rating  03/28/24                            Goal 2) Identify distortions and negative self talk, learn to challenge and reframe AEB Pt report and therapist observation.  Baseline date 03/29/23: Progress towards goal 0; How Often - Daily Target Date Goal Was reviewed Status Code Progress towards goal  03/28/24                                This plan has been reviewed and created by Rhonda following participants:  This plan will be reviewed at least every 12 months. Date Behavioral Health Clinician Date Guardian/Patient   03/29/23               Rhonda Wood Rhonda Wood Rhonda Wood 03/29/23 Verbal Consent Provided                         Rhonda Wood APPL Rhonda Wood

## 2024-03-13 ENCOUNTER — Ambulatory Visit (INDEPENDENT_AMBULATORY_CARE_PROVIDER_SITE_OTHER): Admitting: Psychology

## 2024-03-13 DIAGNOSIS — F411 Generalized anxiety disorder: Secondary | ICD-10-CM | POA: Diagnosis not present

## 2024-03-13 DIAGNOSIS — F330 Major depressive disorder, recurrent, mild: Secondary | ICD-10-CM | POA: Diagnosis not present

## 2024-03-13 NOTE — Progress Notes (Signed)
 [facility] Counselor/Therapist Progress Note  Patient ID: Rhonda Wood, MRN: [id number],    Date: 03/13/2024  Time Spent: 8:00am-9:00am    Treatment Type: Individual Therapy   pt is seen of virtual video visit via caregility. Pt joins from her parked car, reporting privacy, and counselor from her office. Pt consents to virtual visit and is aware of limitations of such visits.   Reported Symptoms: pt reports increased stressors w/ mom's health. Pt reports negative self talk/self worth increased.   Mental Status Exam: Appearance:  Well Groomed     Behavior: Appropriate  Motor: Normal  Speech/Language:  Clear and Coherent  Affect: Appropriate  Mood: anxious and sad  Thought process: normal  Thought content:   WNL  Sensory/Perceptual disturbances:   WNL  Orientation: oriented to person, place, time/date, and situation  Attention: Good  Concentration: Good  Memory: WNL  Fund of knowledge:  Good  Insight:   Good  Judgment:  Good  Impulse Control: Good   Risk Assessment: Danger to Self:  No Self-injurious Behavior: No Danger to Others: No Duty to Warn:no Physical Aggression / Violence:No  Access to Firearms a concern: No  Gang Involvement:No   Subjective: Counselor assessed pt current functioning per pt report.  Processed with pt the stressors recent, recent interactions and emotions. Assisted w/ validating emotions w/ recent stressors and assisted w/ recognizing distortions and how impacting.  Discussed ways of challenging reframing for positive self talk.  Had pt identifying self care practices to assist in coping. Pt affect congruent w reports of overwhelmed w/ increased stressors in past 2 weeks and w/ negative self worth.  pt discussed how mom was in the [place] 2 times in the past 2 weeks-scheduled to be released today.  Pt reports hard to see mom not know who she was or where she was and become almost catatonic.  Pt able to recognizing that mom condition  will only further deteriorate over time and that positive for hospice care to start.  Pt reports w/ siblings visiting she felt upset when teased again.  Pt reflected on how fed into negative self talk that not good enough.  Pt able to challenge and recognize how value and purpose.  Pt discussed plan for her self care and how positive for lunch w/ friend recent and will continue as well as other self care.    Interventions: Cognitive Behavioral Therapy, Assertiveness/Communication, and Mindfulness Meditation  Diagnosis:Generalized anxiety disorder  MDD (major depressive disorder), recurrent episode, mild  Plan: Pt to f/u in 4 weeks for counseling.  Pt to f/u as scheduled w/ PCP.   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 Pt idnetifies that she does better w/ exercise and being active.  Enjoys walking/Hiking.  Pt is considering taking up yoga again.  Pt did Tae kwon do in 20s.  Pt is part of a [name] that meets consistent.  Pt reports in past meditation and breathing work was positive and needs to be back into.    Supports:  friends, sister has stood up for her and care/efforts she is providing to parents.  Pt also reports her mom's friend, Reena, is a support.      Goal/Needs for Treatment:  In order of importance to patient 1) decreased anxiety/depression w/ coping skills 2) improve self talk  3) ---    Client Statement of Needs:  Pt: dealing w/ the anxiety and depression.  Improve the internal self talk so I  can handle things/stressors on outside w/out giving up or avoiding.    Treatment Level:outpatient counseling  Symptoms:anxiety, low self worth/negative self tlak  Client Treatment Preferences:f/u w/ counseling biweekly to monthly.        Healthcare consumer's goal for treatment:   Counselor, Damien Wood, Rhonda Wood will support the patient's ability to achieve the goals identified. Cognitive Behavioral Therapy, Assertive  Communication/Conflict Resolution Training, Relaxation Training, ACT, Humanistic and other evidenced-based practices will be used to promote progress towards healthy functioning.    Healthcare consumer will: Actively participate in therapy, working towards healthy functioning.     *Justification for Continuation/Discontinuation of Goal: R=Revised, O=Ongoing, A=Achieved, D=Discontinued   Goal 1) Learn and implement coping skills to assist w/ managing stress and reduce anxiety and depressive symptoms per pt report and therapist observation.  Baseline date 03/29/23: Progress towards goal 0; How Often - Daily Target Date Goal Was reviewed Status Code Progress towards goal/Likert rating  03/28/24                            Goal 2) Identify distortions and negative self talk, learn to challenge and reframe AEB Pt report and therapist observation.  Baseline date 03/29/23: Progress towards goal 0; How Often - Daily Target Date Goal Was reviewed Status Code Progress towards goal  03/28/24                                This plan has been reviewed and created by the following participants:  This plan will be reviewed at least every 12 months. Date Behavioral Health Clinician Date Guardian/Patient   03/29/23               Rhonda Wood Rhonda Wood 03/29/23 Verbal Consent Provided                                Rhonda Wood

## 2024-04-03 ENCOUNTER — Ambulatory Visit (INDEPENDENT_AMBULATORY_CARE_PROVIDER_SITE_OTHER): Admitting: Psychology

## 2024-04-03 ENCOUNTER — Encounter: Payer: Self-pay | Admitting: Psychology

## 2024-04-03 DIAGNOSIS — F331 Major depressive disorder, recurrent, moderate: Secondary | ICD-10-CM

## 2024-04-03 DIAGNOSIS — F411 Generalized anxiety disorder: Secondary | ICD-10-CM
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
## 2024-05-08 ENCOUNTER — Ambulatory Visit: Admitting: Psychology

## 2024-05-08 DIAGNOSIS — F331 Major depressive disorder, recurrent, moderate: Secondary | ICD-10-CM | POA: Diagnosis not present

## 2024-05-08 DIAGNOSIS — F411 Generalized anxiety disorder: Secondary | ICD-10-CM

## 2024-05-08 NOTE — Progress Notes (Signed)
 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 Pt idnetifies that she does better w/ exercise and being active.  Enjoys walking/Hiking.  Pt is considering taking up yoga again.  Pt did Tae kwon do in 20s.  Pt is part of a [name] that meets consistent.  Pt reports in past meditation and breathing work was positive and needs to be back into.    Supports:  friends, sister has stood up for her and care/efforts she is providing to parents.  Pt also reports her mom's friend, Reena, is a support.      Goal/Needs for Treatment:  In order of importance to patient 1) decreased anxiety/depression w/ coping skills 2) improve self talk  3) ---    Client Statement of Needs:  Pt: dealing w/ the anxiety and depression.  Improve the internal self talk so I can handle things/stressors on outside w/out giving up or avoiding.   Pt: definitely continue to work on my self talk, I need to deal w/ impulsivity that is spending money to make myself feel better- learn alternatives/another way to cope w/ my feelings.  I want to focus on paying myself back and put into my savings account and doing doing things for the house/ and my space in it    Treatment Level:outpatient counseling  Symptoms:anxiety, low self worth/negative self tlak  Client Treatment Preferences:f/u w/ counseling biweekly to monthly.        Healthcare consumer's goal for treatment:   Counselor, [name], [facility] will support the patient's ability to achieve the goals identified. Cognitive Behavioral Therapy, Assertive Communication/Conflict Resolution Training, Relaxation Training, ACT, Humanistic and other evidenced-based practices will be used to promote progress towards healthy functioning.    Healthcare consumer will: Actively participate in therapy, working towards healthy functioning.     *Justification for Continuation/Discontinuation of Goal: R=Revised, O=Ongoing,  A=Achieved, D=Discontinued   Goal 1) Learn and implement coping skills to assist w/ managing stress and reduce anxiety and depressive symptoms per pt report and therapist observation.  Baseline date 04/03/24: Progress towards goal 25; How Often - Daily Target Date Goal Was reviewed Status Code Progress towards goal/Likert rating  04/03/25                            Goal 2) Identify distortions and negative self talk, learn to challenge and reframe AEB Pt report and therapist observation.  Baseline date 04/03/24: Progress towards goal 25; How Often - Daily Target Date Goal Was reviewed Status Code Progress towards goal  04/03/25                            Goal 2) Increased her financial independence, increase her savings and creating her own space w/in her parents home AEB pt report.  Baseline date 04/03/24: Progress towards goal 0; How Often - Daily Target Date Goal Was reviewed Status Code Progress towards goal  04/03/25                      This plan has been reviewed and created by the following participants:  This plan will be reviewed at least every 12 months. Date Behavioral Health Clinician Date Guardian/Patient   04/03/24       [facility] Herald [facility] 04/03/24 Verbal Consent Provided and electronic signature requested  Duane Trias, [facility]               [place], LCMHC

## 2024-05-08 NOTE — Progress Notes (Signed)
 [facility] Counselor/Therapist Progress Note  Patient ID: Rhonda Wood, MRN: [id number],    Date: 05/08/2024  Time Spent: 8:02am-9:00am    Treatment Type: Individual Therapy   pt is seen of virtual video visit via caregility. Pt joins from her home, reporting privacy, and counselor from her office. Pt consents to virtual visit and is aware of limitations of such visits.   Reported Symptoms: pt reports increased feeling down and negative self talk.   Mental Status Exam: Appearance:  Well Groomed     Behavior: Appropriate  Motor: Normal  Speech/Language:  Clear and Coherent  Affect: Appropriate  Mood: anxious and depressed  Thought process: normal  Thought content:   WNL  Sensory/Perceptual disturbances:   WNL  Orientation: oriented to person, place, time/date, and situation  Attention: Good  Concentration: Good  Memory: WNL  Fund of knowledge:  Good  Insight:   Good  Judgment:  Good  Impulse Control: Good   Risk Assessment: Danger to Self:  No Self-injurious Behavior: No Danger to Others: No Duty to Warn:no Physical Aggression / Violence:No  Access to Firearms a concern: No  Gang Involvement:No   Subjective: Counselor assessed pt current functioning per pt report.  Processed with pt there increased depression and negative self talk.  Explored w/pt contributing factors and stressors.  Assisted w/ recognizing distortion patterns and ways to challenge and reframe.  Discussed support pt has become aware of in past week and reframing distortions.  Discussed self care strategies and panning for.  Pt affect wnl.  Pt reports last week was difficult week and felt self getting depressed and withdrawing.  Pt reports mood felt at low on Friday and friend reach out and also had support from another friend/spiritual mentor.  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   Pt dicussed feeling of rejection and how also felt this w/ dad at home refusing to accept help.  Pt reflected on negative self talk and recognized distortions.  Pt was able to challenge and reframe and reflect on increased insight that has built support for herself and is able to utilize support.  Pt reports weekend some improvement and is taking off this week from work  pt identified self care and engaging in activities for self.   Interventions: Cognitive Behavioral Therapy, Assertiveness/Communication, and Interpersonal  Diagnosis:Major depressive disorder, recurrent episode, moderate (HCC)  Generalized anxiety disorder  Plan: Pt to f/u in 4 weeks for counseling.  Pt to f/u as scheduled w/ PCP.        [name], LCMHC

## 2024-06-13 ENCOUNTER — Ambulatory Visit: Admitting: Psychology

## 2024-06-13 DIAGNOSIS — F411 Generalized anxiety disorder: Secondary | ICD-10-CM | POA: Diagnosis not present

## 2024-06-13 DIAGNOSIS — F331 Major depressive disorder, recurrent, moderate: Secondary | ICD-10-CM | POA: Diagnosis not present

## 2024-06-13 NOTE — Progress Notes (Signed)
 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 Pt idnetifies that she does better w/ exercise and being active.  Enjoys walking/Hiking.  Pt is considering taking up yoga again.  Pt did Tae kwon do in 20s.  Pt is part of a [name] that meets consistent.  Pt reports in past meditation and breathing work was positive and needs to be back into.    Supports:  friends, sister has stood up for her and care/efforts she is providing to parents.  Pt also reports her mom's friend, Reena, is a support.      Goal/Needs for Treatment:  In order of importance to patient 1) decreased anxiety/depression w/ coping skills 2) improve self talk  3) ---    Client Statement of Needs:  Pt: dealing w/ the anxiety and depression.  Improve the internal self talk so I can handle things/stressors on outside w/out giving up or avoiding.   Pt: definitely continue to work on my self talk, I need to deal w/ impulsivity that is spending money to make myself feel better- learn alternatives/another way to cope w/ my feelings.  I want to focus on paying myself back and put into my savings account and doing doing things for the house/ and my space in it    Treatment Level:outpatient counseling  Symptoms:anxiety, low self worth/negative self tlak  Client Treatment Preferences:f/u w/ counseling biweekly to monthly.        Healthcare consumer's goal for treatment:   Counselor, [name], [facility] will support the patient's ability to achieve the goals identified. Cognitive Behavioral Therapy, Assertive Communication/Conflict Resolution Training, Relaxation Training, ACT, Humanistic and other evidenced-based practices will be used to promote progress towards healthy functioning.    Healthcare consumer will: Actively participate in therapy, working towards healthy functioning.     *Justification for Continuation/Discontinuation of Goal: R=Revised, O=Ongoing,  A=Achieved, D=Discontinued   Goal 1) Learn and implement coping skills to assist w/ managing stress and reduce anxiety and depressive symptoms per pt report and therapist observation.  Baseline date 04/03/24: Progress towards goal 25; How Often - Daily Target Date Goal Was reviewed Status Code Progress towards goal/Likert rating  04/03/25                            Goal 2) Identify distortions and negative self talk, learn to challenge and reframe AEB Pt report and therapist observation.  Baseline date 04/03/24: Progress towards goal 25; How Often - Daily Target Date Goal Was reviewed Status Code Progress towards goal  04/03/25                            Goal 2) Increased her financial independence, increase her savings and creating her own space w/in her parents home AEB pt report.  Baseline date 04/03/24: Progress towards goal 0; How Often - Daily Target Date Goal Was reviewed Status Code Progress towards goal  04/03/25                      This plan has been reviewed and created by the following participants:  This plan will be reviewed at least every 12 months. Date Behavioral Health Clinician Date Guardian/Patient   04/03/24       [name] 04/03/24 Verbal Consent Provided and electronic signature requested  [name], [facility] "

## 2024-06-13 NOTE — Progress Notes (Signed)
"    [facility] Counselor/Therapist Progress Note  Patient ID: Rhonda Wood, MRN: [id number],    Date: 06/13/2024  Time Spent: 8:01am-8:56am    Treatment Type: Individual Therapy   pt is seen of virtual video visit via caregility. Pt joins from her car at work, reporting privacy, and counselor from her home office. Pt consents to virtual visit and is aware of limitations of such visits.   Reported Symptoms: pt reports stress w/ mom's decline and grieving .   Mental Status Exam: Appearance:  Well Groomed     Behavior: Appropriate  Motor: Normal  Speech/Language:  Clear and Coherent  Affect: Appropriate  Mood: anxious and depressed  Thought process: normal  Thought content:   WNL  Sensory/Perceptual disturbances:   WNL  Orientation: oriented to person, place, time/date, and situation  Attention: Good  Concentration: Good  Memory: WNL  Fund of knowledge:  Good  Insight:   Good  Judgment:  Good  Impulse Control: Good   Risk Assessment: Danger to Self:  No Self-injurious Behavior: No Danger to Others: No Duty to Warn:no Physical Aggression / Violence:No  Access to Firearms a concern: No  Gang Involvement:No   Subjective: Counselor assessed pt current functioning per pt report.  Processed with pt stres w/ mom in hospice care and eventual loss.  Validated and normalized anxiety, worry, sadness and  frustrations.  Discussed how her experience is impacted by living in the home.  Disucssed pt support and need for break that is not work.  Pt affect congruent w/ reported emotions.  Pt reports feeling stressed, overwhelmed, sad, worried and frustrations as sees mom's health further decline.  Pt reports that over past couple weeks mom keeps regressing and unable to respond at times.  Pt reports that has received course of antibiotics which have helped but not longer assisted to keep lucid.  Pt is worried for toll takes on her father who is primary caregiver and recognized may  not be able to care for as she continues to decline.  Pt reports hospice nurse is looking at potential placement in hospice care facility.  Pt discussed how she is struggling to see mom at home continued to decline and feels mom is getting tired of fighting.  Pt discussed how her experience is different from siblings as living in the home w/ her parents.  Pt recognized only break from house is work and occasional errands.  Pt discussed connection to her supports/friends and being intentional about breaks w/ walks or get together w/ friend for her coping.    Interventions: Cognitive Behavioral Therapy, Assertiveness/Communication, and Interpersonal  Diagnosis:Major depressive disorder, recurrent episode, moderate (HCC)  Generalized anxiety disorder  Plan: Pt to f/u in 3/4 weeks for counseling.  Pt to f/u as scheduled w/ PCP.          [name], [facility] "

## 2024-07-04 ENCOUNTER — Ambulatory Visit: Admitting: Psychology

## 2024-07-04 DIAGNOSIS — F411 Generalized anxiety disorder: Secondary | ICD-10-CM

## 2024-07-04 DIAGNOSIS — F331 Major depressive disorder, recurrent, moderate: Secondary | ICD-10-CM

## 2024-07-04 NOTE — Progress Notes (Signed)
"    [facility] Counselor/Therapist Progress Note  Patient ID: Rhonda Wood, MRN: [id number],    Date: 07/04/2024  Time Spent: 8:11am-9:01am    Treatment Type: Individual Therapy   pt is seen of virtual video visit via caregility. 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 Pt consents to virtual visit and is aware of limitations of such visits.  Initial connectivity issues for pt and therapist delayed start of appointment.  Reported Symptoms: pt reports grief w/ mother's recent death .   Mental Status Exam: Appearance:  Well Groomed     Behavior: Appropriate  Motor: Normal  Speech/Language:  Clear and Coherent  Affect: Appropriate  Mood: sad  Thought process: normal  Thought content:   WNL  Sensory/Perceptual disturbances:   WNL  Orientation: oriented to person, place, time/date, and situation  Attention: Good  Concentration: Good  Memory: WNL  Fund of knowledge:  Good  Insight:   Good  Judgment:  Good  Impulse Control: Good   Risk Assessment: Danger to Self:  No Self-injurious Behavior: No Danger to Others: No Duty to Warn:no Physical Aggression / Violence:No  Access to Firearms a concern: No  Gang Involvement:No   Subjective: Counselor assessed pt current functioning per pt report.  Processed with pt her mom's death and pt grief.   Validated and normalized emotions experiencing.  Provided psychoeducation re: grief.  Explore engagement, support and interactions.  Pt affect wnl.  Pt reports her mom died on 17-Jun-2024 shortly after coming to hospice inpt care. Pt discussed her presence at her mom's death, interactions w/ family, funeral and going through estate.  Pt discussed thoughts of her mom when she did have pain, worry for her dad and her own sadness.  Pt able to talk about her supports and connection w/ sister.  Pt aware that will be days w/ differing experience of grief.    Interventions: Cognitive Behavioral Therapy,  Interpersonal, and supportive  Diagnosis:Major depressive disorder, recurrent episode, moderate (HCC)  Generalized anxiety disorder  Plan: Pt to f/u in 3-4 weeks for counseling.  Pt to f/u as scheduled w/ PCP.        [name], [facility] "

## 2024-07-04 NOTE — Progress Notes (Signed)
 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 Pt idnetifies that she does better w/ exercise and being active.  Enjoys walking/Hiking.  Pt is considering taking up yoga again.  Pt did Tae kwon do in 20s.  Pt is part of a [name] that meets consistent.  Pt reports in past meditation and breathing work was positive and needs to be back into.    Supports:  friends, sister has stood up for her and care/efforts she is providing to parents.  Pt also reports her mom's friend, Reena, is a support.      Goal/Needs for Treatment:  In order of importance to patient 1) decreased anxiety/depression w/ coping skills 2) improve self talk  3) ---    Client Statement of Needs:  Pt: dealing w/ the anxiety and depression.  Improve the internal self talk so I can handle things/stressors on outside w/out giving up or avoiding.   Pt: definitely continue to work on my self talk, I need to deal w/ impulsivity that is spending money to make myself feel better- learn alternatives/another way to cope w/ my feelings.  I want to focus on paying myself back and put into my savings account and doing doing things for the house/ and my space in it    Treatment Level:outpatient counseling  Symptoms:anxiety, low self worth/negative self tlak  Client Treatment Preferences:f/u w/ counseling biweekly to monthly.        Healthcare consumer's goal for treatment:   Counselor, [name], [facility] will support the patient's ability to achieve the goals identified. Cognitive Behavioral Therapy, Assertive Communication/Conflict Resolution Training, Relaxation Training, ACT, Humanistic and other evidenced-based practices will be used to promote progress towards healthy functioning.    Healthcare consumer will: Actively participate in therapy, working towards healthy functioning.     *Justification for Continuation/Discontinuation of Goal: R=Revised, O=Ongoing,  A=Achieved, D=Discontinued   Goal 1) Learn and implement coping skills to assist w/ managing stress and reduce anxiety and depressive symptoms per pt report and therapist observation.  Baseline date 04/03/24: Progress towards goal 25; How Often - Daily Target Date Goal Was reviewed Status Code Progress towards goal/Likert rating  04/03/25                            Goal 2) Identify distortions and negative self talk, learn to challenge and reframe AEB Pt report and therapist observation.  Baseline date 04/03/24: Progress towards goal 25; How Often - Daily Target Date Goal Was reviewed Status Code Progress towards goal  04/03/25                            Goal 2) Increased her financial independence, increase her savings and creating her own space w/in her parents home AEB pt report.  Baseline date 04/03/24: Progress towards goal 0; How Often - Daily Target Date Goal Was reviewed Status Code Progress towards goal  04/03/25                      This plan has been reviewed and created by the following participants:  This plan will be reviewed at least every 12 months. Date Behavioral Health Clinician Date Guardian/Patient   04/03/24       [name] 04/03/24 Verbal Consent Provided and electronic signature requested  Rhonda Wood, [facility] "

## 2024-08-01 ENCOUNTER — Ambulatory Visit: Admitting: Psychology

## 2025-01-22 ENCOUNTER — Encounter: Admitting: Physician Assistant
